# Patient Record
Sex: Female | Born: 1965 | Hispanic: Yes | Marital: Married | Smoking: Never smoker
Health system: Southern US, Community
[De-identification: ages and names within clinical notes are randomized; demographics above are authoritative.]

## PROBLEM LIST (undated history)

## (undated) DIAGNOSIS — D649 Anemia, unspecified: Secondary | ICD-10-CM

## (undated) DIAGNOSIS — D803 Selective deficiency of immunoglobulin G [IgG] subclasses: Secondary | ICD-10-CM

## (undated) DIAGNOSIS — H04123 Dry eye syndrome of bilateral lacrimal glands: Secondary | ICD-10-CM

## (undated) DIAGNOSIS — D8984 IgG4-related disease: Secondary | ICD-10-CM

## (undated) DIAGNOSIS — D8989 Other specified disorders involving the immune mechanism, not elsewhere classified: Secondary | ICD-10-CM

## (undated) HISTORY — PX: SALIVARY GLAND SURGERY: SHX768

## (undated) HISTORY — PX: CHOLECYSTECTOMY: SHX55

## (undated) HISTORY — DX: Selective deficiency of immunoglobulin g (igg) subclasses: D80.3

## (undated) HISTORY — PX: BREAST SURGERY: SHX581

## (undated) HISTORY — DX: Other specified disorders involving the immune mechanism, not elsewhere classified: D89.89

## (undated) HISTORY — DX: Dry eye syndrome of bilateral lacrimal glands: H04.123

## (undated) HISTORY — DX: IgG4-related disease: D89.84

---

## 2007-10-15 ENCOUNTER — Ambulatory Visit: Payer: Self-pay | Admitting: Family Medicine

## 2008-04-15 ENCOUNTER — Ambulatory Visit: Payer: Self-pay | Admitting: Unknown Physician Specialty

## 2008-05-05 ENCOUNTER — Ambulatory Visit: Payer: Self-pay | Admitting: Unknown Physician Specialty

## 2010-07-03 ENCOUNTER — Ambulatory Visit: Payer: Self-pay | Admitting: Internal Medicine

## 2010-10-17 ENCOUNTER — Ambulatory Visit: Payer: Self-pay | Admitting: Internal Medicine

## 2010-11-17 ENCOUNTER — Ambulatory Visit: Payer: Self-pay | Admitting: Gastroenterology

## 2011-04-11 ENCOUNTER — Ambulatory Visit: Payer: Self-pay | Admitting: Unknown Physician Specialty

## 2014-11-22 ENCOUNTER — Other Ambulatory Visit: Payer: Self-pay | Admitting: Otolaryngology

## 2014-11-22 DIAGNOSIS — R609 Edema, unspecified: Secondary | ICD-10-CM

## 2014-11-23 ENCOUNTER — Ambulatory Visit
Admission: RE | Admit: 2014-11-23 | Discharge: 2014-11-23 | Disposition: A | Discharge status: Home / Self Care | Payer: BLUE CROSS/BLUE SHIELD | Source: Ambulatory Visit | Attending: Otolaryngology | Admitting: Otolaryngology

## 2014-11-23 DIAGNOSIS — R599 Enlarged lymph nodes, unspecified: Secondary | ICD-10-CM | POA: Insufficient documentation

## 2014-11-23 DIAGNOSIS — R609 Edema, unspecified: Secondary | ICD-10-CM

## 2014-11-23 MED ORDER — IOHEXOL 300 MG/ML  SOLN
75.0000 mL | Freq: Once | INTRAMUSCULAR | Status: AC | PRN
  Administered 2014-11-23: 75 mL via INTRAVENOUS

## 2014-11-24 ENCOUNTER — Other Ambulatory Visit: Payer: Self-pay | Admitting: Otolaryngology

## 2014-11-24 DIAGNOSIS — R221 Localized swelling, mass and lump, neck: Principal | ICD-10-CM

## 2014-11-24 DIAGNOSIS — R220 Localized swelling, mass and lump, head: Secondary | ICD-10-CM

## 2014-11-29 ENCOUNTER — Ambulatory Visit: Payer: BLUE CROSS/BLUE SHIELD

## 2014-12-10 ENCOUNTER — Other Ambulatory Visit: Payer: BLUE CROSS/BLUE SHIELD

## 2014-12-13 ENCOUNTER — Encounter: Payer: Self-pay | Admitting: *Deleted

## 2014-12-13 DIAGNOSIS — Z9049 Acquired absence of other specified parts of digestive tract: Secondary | ICD-10-CM | POA: Diagnosis not present

## 2014-12-13 DIAGNOSIS — Z833 Family history of diabetes mellitus: Secondary | ICD-10-CM | POA: Diagnosis not present

## 2014-12-13 DIAGNOSIS — Z8249 Family history of ischemic heart disease and other diseases of the circulatory system: Secondary | ICD-10-CM | POA: Diagnosis not present

## 2014-12-13 DIAGNOSIS — Z79899 Other long term (current) drug therapy: Secondary | ICD-10-CM | POA: Diagnosis not present

## 2014-12-13 DIAGNOSIS — Z8489 Family history of other specified conditions: Secondary | ICD-10-CM | POA: Diagnosis not present

## 2014-12-13 DIAGNOSIS — R591 Generalized enlarged lymph nodes: Secondary | ICD-10-CM | POA: Diagnosis not present

## 2014-12-13 NOTE — Patient Instructions (Signed)
  Your procedure is scheduled on: 12-15-14 Report to [facility] Desk To find out your arrival time please call [phone] between 1PM - 3PM on 12-14-14  Remember: Instructions that are not followed completely may result in serious medical risk, up to and including death, or upon the discretion of your surgeon and anesthesiologist your surgery may need to be rescheduled.    _x___ 1. Do not eat food or drink liquids after midnight. No gum chewing or hard candies.     _x___ 2. No Alcohol for 24 hours before or after surgery.   ____ 3. Bring all medications with you on the day of surgery if instructed.    ____ 4. Notify your doctor if there is any change in your medical condition     (cold, fever, infections).     Do not wear jewelry, make-up, hairpins, clips or nail polish.  Do not wear lotions, powders, or perfumes. You may wear deodorant.  Do not shave 48 hours prior to surgery. Men may shave face and neck.  Do not bring valuables to the hospital.    [facility] is not responsible for any belongings or valuables.               Contacts, dentures or bridgework may not be worn into surgery.  Leave your suitcase in the car. After surgery it may be brought to your room.  For patients admitted to the hospital, discharge time is determined by your                treatment team.   Patients discharged the day of surgery will not be allowed to drive home.   Please read over the following fact sheets that you were given:      ____ Take these medicines the morning of surgery with A SIP OF WATER: NONE   1.  2.   3.   4.  5.  6.  ____ Fleet Enema (as directed)   ____ Use CHG Soap as directed  ____ Use inhalers on the day of surgery  ____ Stop metformin 2 days prior to surgery    ____ Take 1/2 of usual insulin dose the night before surgery and none on the morning of surgery.   ____ Stop Coumadin/Plavix/aspirin  ____ Stop Anti-inflammatories-TYLENOL OK TO  TAKE   ____ Stop supplements until after surgery.    ____ Bring C-Pap to the hospital.

## 2014-12-15 ENCOUNTER — Ambulatory Visit: Payer: BLUE CROSS/BLUE SHIELD | Admitting: Anesthesiology

## 2014-12-15 ENCOUNTER — Encounter
Admission: RE | Disposition: A | Discharge status: Home / Self Care | Payer: Self-pay | Source: Ambulatory Visit | Attending: Otolaryngology

## 2014-12-15 ENCOUNTER — Ambulatory Visit
Admission: RE | Admit: 2014-12-15 | Discharge: 2014-12-15 | Disposition: A | Discharge status: Home / Self Care | Payer: BLUE CROSS/BLUE SHIELD | Source: Ambulatory Visit | Attending: Otolaryngology | Admitting: Otolaryngology

## 2014-12-15 DIAGNOSIS — R591 Generalized enlarged lymph nodes: Secondary | ICD-10-CM | POA: Diagnosis not present

## 2014-12-15 DIAGNOSIS — Z8249 Family history of ischemic heart disease and other diseases of the circulatory system: Secondary | ICD-10-CM | POA: Insufficient documentation

## 2014-12-15 DIAGNOSIS — R590 Localized enlarged lymph nodes: Secondary | ICD-10-CM

## 2014-12-15 DIAGNOSIS — R609 Edema, unspecified: Secondary | ICD-10-CM

## 2014-12-15 DIAGNOSIS — Z8489 Family history of other specified conditions: Secondary | ICD-10-CM | POA: Insufficient documentation

## 2014-12-15 DIAGNOSIS — Z79899 Other long term (current) drug therapy: Secondary | ICD-10-CM | POA: Insufficient documentation

## 2014-12-15 DIAGNOSIS — Z833 Family history of diabetes mellitus: Secondary | ICD-10-CM | POA: Insufficient documentation

## 2014-12-15 DIAGNOSIS — Z9049 Acquired absence of other specified parts of digestive tract: Secondary | ICD-10-CM | POA: Insufficient documentation

## 2014-12-15 HISTORY — DX: Anemia, unspecified: D64.9

## 2014-12-15 HISTORY — PX: LYMPH NODE BIOPSY: SHX201

## 2014-12-15 LAB — POCT PREGNANCY, URINE: Preg Test, Ur: NEGATIVE

## 2014-12-15 SURGERY — LYMPH NODE BIOPSY
Anesthesia: General | Laterality: Right

## 2014-12-15 MED ORDER — FENTANYL CITRATE (PF) 100 MCG/2ML IJ SOLN
INTRAMUSCULAR | Status: DC | PRN
  Administered 2014-12-15: 100 ug via INTRAVENOUS

## 2014-12-15 MED ORDER — FAMOTIDINE 20 MG PO TABS
ORAL_TABLET | ORAL | Status: AC
  Administered 2014-12-15: 20 mg via ORAL
  Filled 2014-12-15: qty 1

## 2014-12-15 MED ORDER — BACITRACIN ZINC 500 UNIT/GM EX OINT
TOPICAL_OINTMENT | CUTANEOUS | Status: AC
  Filled 2014-12-15: qty 0.9

## 2014-12-15 MED ORDER — FENTANYL CITRATE (PF) 100 MCG/2ML IJ SOLN
25.0000 ug | INTRAMUSCULAR | Status: AC | PRN
  Administered 2014-12-15 (×6): 25 ug via INTRAVENOUS

## 2014-12-15 MED ORDER — ROCURONIUM BROMIDE 100 MG/10ML IV SOLN
INTRAVENOUS | Status: DC | PRN
  Administered 2014-12-15: 20 mg via INTRAVENOUS

## 2014-12-15 MED ORDER — NEOSTIGMINE METHYLSULFATE 10 MG/10ML IV SOLN
INTRAVENOUS | Status: DC | PRN
  Administered 2014-12-15: 3.5 mg via INTRAVENOUS

## 2014-12-15 MED ORDER — BACITRACIN 500 UNIT/GM EX OINT
TOPICAL_OINTMENT | CUTANEOUS | Status: DC | PRN
  Administered 2014-12-15: 1 via TOPICAL

## 2014-12-15 MED ORDER — MIDAZOLAM HCL 2 MG/2ML IJ SOLN
INTRAMUSCULAR | Status: DC | PRN
  Administered 2014-12-15: 2 mg via INTRAVENOUS
  Administered 2014-12-15: 1 mg via INTRAVENOUS

## 2014-12-15 MED ORDER — LACTATED RINGERS IV SOLN
INTRAVENOUS | Status: DC | PRN
  Administered 2014-12-15: 09:00:00 via INTRAVENOUS

## 2014-12-15 MED ORDER — DEXAMETHASONE SODIUM PHOSPHATE 4 MG/ML IJ SOLN
INTRAMUSCULAR | Status: DC | PRN
  Administered 2014-12-15: 8 mg via INTRAVENOUS

## 2014-12-15 MED ORDER — SODIUM CHLORIDE 0.9 % IR SOLN
Status: DC | PRN
  Administered 2014-12-15: 125 mL

## 2014-12-15 MED ORDER — ONDANSETRON HCL 4 MG/2ML IJ SOLN
INTRAMUSCULAR | Status: DC | PRN
  Administered 2014-12-15: 4 mg via INTRAVENOUS

## 2014-12-15 MED ORDER — ONDANSETRON HCL 4 MG/2ML IJ SOLN: 4.0000 mg | Freq: Once | INTRAMUSCULAR | Status: DC | PRN

## 2014-12-15 MED ORDER — GLYCOPYRROLATE 0.2 MG/ML IJ SOLN
INTRAMUSCULAR | Status: DC | PRN
  Administered 2014-12-15: .6 mg via INTRAVENOUS

## 2014-12-15 MED ORDER — LIDOCAINE-EPINEPHRINE (PF) 1 %-1:200000 IJ SOLN
INTRAMUSCULAR | Status: AC
  Filled 2014-12-15: qty 30

## 2014-12-15 MED ORDER — FENTANYL CITRATE (PF) 100 MCG/2ML IJ SOLN
INTRAMUSCULAR | Status: AC
  Administered 2014-12-15: 25 ug via INTRAVENOUS
  Filled 2014-12-15: qty 2

## 2014-12-15 MED ORDER — LACTATED RINGERS IV SOLN
Freq: Once | INTRAVENOUS | Status: AC
  Administered 2014-12-15: 08:00:00 via INTRAVENOUS

## 2014-12-15 MED ORDER — EPHEDRINE SULFATE 50 MG/ML IJ SOLN
INTRAMUSCULAR | Status: DC | PRN
  Administered 2014-12-15: 5 mg via INTRAVENOUS
  Administered 2014-12-15: 10 mg via INTRAVENOUS

## 2014-12-15 MED ORDER — PROPOFOL 10 MG/ML IV BOLUS
INTRAVENOUS | Status: DC | PRN
  Administered 2014-12-15: 200 mg via INTRAVENOUS

## 2014-12-15 MED ORDER — LIDOCAINE-EPINEPHRINE (PF) 1 %-1:200000 IJ SOLN
INTRAMUSCULAR | Status: DC | PRN
  Administered 2014-12-15: 3 mL

## 2014-12-15 MED ORDER — FAMOTIDINE 20 MG PO TABS
20.0000 mg | ORAL_TABLET | Freq: Once | ORAL | Status: AC
  Administered 2014-12-15: 20 mg via ORAL

## 2014-12-15 MED ORDER — HYDROCODONE-ACETAMINOPHEN 5-300 MG PO TABS: 1.0000 | ORAL_TABLET | ORAL | Status: DC | PRN

## 2014-12-15 SURGICAL SUPPLY — 34 items
BLADE SURG 15 STRL LF DISP TIS (BLADE) ×1 IMPLANT
BLADE SURG 15 STRL SS (BLADE) ×1
CANISTER SUCT 1200ML W/VALVE (MISCELLANEOUS) ×2 IMPLANT
CNTNR SPEC 2.5X3XGRAD LEK (MISCELLANEOUS) ×1
CONT SPEC 4OZ STER OR WHT (MISCELLANEOUS) ×1
CONTAINER SPEC 2.5X3XGRAD LEK (MISCELLANEOUS) ×1 IMPLANT
CORD BIP STRL DISP 12FT (MISCELLANEOUS) ×2 IMPLANT
DECANTER SPIKE VIAL GLASS SM (MISCELLANEOUS) ×2 IMPLANT
DRAPE MAG INST 16X20 L/F (DRAPES) ×2 IMPLANT
DRESSING TELFA 4X3 1S ST N-ADH (GAUZE/BANDAGES/DRESSINGS) IMPLANT
DRSG TEGADERM 2-3/8X2-3/4 SM (GAUZE/BANDAGES/DRESSINGS) IMPLANT
DRSG TEGADERM 2X2.25 PEDS (GAUZE/BANDAGES/DRESSINGS) ×2 IMPLANT
FORCEPS JEWEL BIP 4-3/4 STR (INSTRUMENTS) ×2 IMPLANT
GLOVE BIO SURGEON STRL SZ7.5 (GLOVE) ×4 IMPLANT
GOWN STRL REUS W/ TWL LRG LVL3 (GOWN DISPOSABLE) ×2 IMPLANT
GOWN STRL REUS W/TWL LRG LVL3 (GOWN DISPOSABLE) ×2
HARMONIC SCALPEL FOCUS (MISCELLANEOUS) IMPLANT
HOOK STAY 5M SHARP BLUNT 3316- (MISCELLANEOUS) IMPLANT
JACKSON PRATT 7MM (INSTRUMENTS) IMPLANT
KIT RM TURNOVER STRD PROC AR (KITS) ×2 IMPLANT
LABEL OR SOLS (LABEL) IMPLANT
NS IRRIG 500ML POUR BTL (IV SOLUTION) ×2 IMPLANT
PACK HEAD/NECK (MISCELLANEOUS) ×2 IMPLANT
PAD GROUND ADULT SPLIT (MISCELLANEOUS) ×2 IMPLANT
PAD TELFA 2X3 NADH STRL (GAUZE/BANDAGES/DRESSINGS) ×2 IMPLANT
SPONGE KITTNER 5P (MISCELLANEOUS) ×2 IMPLANT
SUCTION FRAZIER TIP 10 FR DISP (SUCTIONS) IMPLANT
SUT PROLENE 3 0 FS 2 (SUTURE) ×2 IMPLANT
SUT SILK 3 0 (SUTURE)
SUT SILK 3-0 18XBRD TIE 12 (SUTURE) IMPLANT
SUT VIC AB 4-0 FS2 27 (SUTURE) IMPLANT
SUT VIC AB 4-0 RB1 18 (SUTURE) ×2 IMPLANT
SUTURE ×2 IMPLANT
VIALFLOW ×2 IMPLANT

## 2014-12-15 NOTE — Discharge Instructions (Signed)
AMBULATORY SURGERY  DISCHARGE INSTRUCTIONS   1) The drugs that you were given will stay in your system until tomorrow so for the next 24 hours you should not:  A) Drive an automobile B) Make any legal decisions C) Drink any alcoholic beverage   2) You may resume regular meals tomorrow.  Today it is better to start with liquids and gradually work up to solid foods.  You may eat anything you prefer, but it is better to start with liquids, then soup and crackers, and gradually work up to solid foods.   3) Please notify your doctor immediately if you have any unusual bleeding, trouble breathing, redness and pain at the surgery site, drainage, fever, or pain not relieved by medication. 4)   5) Your post-operative visit with Dr.    George Ina                                 is: Date:                        Time:    Please call to schedule your post-operative visit.  6) Additional Instructions: 7)

## 2014-12-15 NOTE — Op Note (Signed)
12/15/2014  10:56 AM    Connie Charles  [id number]   Pre-Op Diagnosis:  right cervical lymphadanopathy Post-op Diagnosis: Same  Procedure:   Excisional biopsy of right deep cervical lymph node (group 1) Surgeon:  Riley Nearing  Anesthesia:  General endotracheal  EBL:  5 cc  Complications:  None  Findings: 28 x 20 mm right group 1 cervical lymph node  Procedure: After the patient was identified in holding and the procedure was reviewed.  The patient was taken to the operating room and with the patient in a comfortable supine position,  general endotracheal anesthesia was induced without difficulty.  A proper time-out was performed, confirming the operative site and procedure.  The skin was injected with 1% lidocaine with epinephrine 1:200,000, 2 fingerbreadths below the mandible. The patient was then prepped and draped in the usual sterile fashion with the lower lip exposed to help monitor the marginal mandibular nerve during the procedure. I then confirmed with anesthesia that the patient's paralysis from induction had worn off. A 15 blade was used to incise the skin, carrying the incision down through the platysma carefully using the bipolar cautery, and carefully monitoring the marginal mandibular nerve. Blunt dissection proceeded deeply until the lower edge of the right submandibular gland was identified. Dissection then proceeded superiorly to the lymph node, which was just above the submandibular gland. The lymph node was then carefully dissected out bluntly. Attachments to the lymph node were divided with the bipolar cautery, again monitoring for any stimulation of the marginal mandibular nerve. At no point during the procedure did the nerve stimulate. Continued careful dissection around the node was performed until the node could be pulled out from the depths of the wound. The hilum of the node was then divided with the bipolar cautery. The node was removed and sent for lymphoma  studies. The wound was irrigated with saline and inspected for bleeding. Excellent hemostasis was noted. The platysma was then carefully closed with 4-0 Vicryl suture followed by closure of the subcutaneous tissues. The skin was closed with 5-0 Prolene suture in a running locked stitch. Bacitracin was applied to the wound followed by a dressing.  The patient was then returned to the anesthesiologist in good condition for awakening. The patient was awakened and taken to the recovery room in good condition.   Disposition:   PACU then d/c home  Plan: Polysporin to the wound twice daily for 5 days. Ice may be applied as needed. Follow-up in 1 week for suture removal. Take pain medications as prescribed.  Riley Nearing 12/15/2014 10:56 AM

## 2014-12-15 NOTE — Anesthesia Preprocedure Evaluation (Signed)
Anesthesia Evaluation  Patient identified by MRN, date of birth, ID band Patient awake    Reviewed: Allergy & Precautions, NPO status , Patient's Chart, lab work & pertinent test results  History of Anesthesia Complications Negative for: history of anesthetic complications  Airway Mallampati: II       Dental no notable dental hx. (+) Teeth Intact   Pulmonary neg pulmonary ROS,    Pulmonary exam normal       Cardiovascular negative cardio ROS Normal cardiovascular exam    Neuro/Psych negative neurological ROS  negative psych ROS   GI/Hepatic negative GI ROS, Neg liver ROS,   Endo/Other  negative endocrine ROS  Renal/GU negative Renal ROS  negative genitourinary   Musculoskeletal negative musculoskeletal ROS (+)   Abdominal Normal abdominal exam  (+)   Peds negative pediatric ROS (+)  Hematology  (+) anemia ,   Anesthesia Other Findings   Reproductive/Obstetrics negative OB ROS                             Anesthesia Physical Anesthesia Plan  ASA: II  Anesthesia Plan: General   Post-op Pain Management:    Induction: Intravenous  Airway Management Planned: LMA  Additional Equipment:   Intra-op Plan:   Post-operative Plan: Extubation in OR  Informed Consent: I have reviewed the patients History and Physical, chart, labs and discussed the procedure including the risks, benefits and alternatives for the proposed anesthesia with the patient or authorized representative who has indicated his/her understanding and acceptance.     Plan Discussed with: CRNA  Anesthesia Plan Comments:         Anesthesia Quick Evaluation

## 2014-12-15 NOTE — OR Nursing (Signed)
Pt. Voided on emergence from anesthesia

## 2014-12-15 NOTE — Anesthesia Procedure Notes (Signed)
Procedure Name: Intubation Performed by: Cristie Mckinney Pre-anesthesia Checklist: Patient identified, Emergency Drugs available, Suction available, Patient being monitored and Timeout performed Patient Re-evaluated:Patient Re-evaluated prior to inductionOxygen Delivery Method: Circle system utilized Preoxygenation: Pre-oxygenation with 100% oxygen Intubation Type: IV induction Ventilation: Mask ventilation without difficulty Laryngoscope Size: Mac and 3 Grade View: Grade II Tube type: Oral Tube size: 6.0 mm Number of attempts: 3 Airway Equipment and Method: Stylet Placement Confirmation: ETT inserted through vocal cords under direct vision,  positive ETCO2 and breath sounds checked- equal and bilateral Secured at: 20 cm Tube secured with: Tape Dental Injury: Teeth and Oropharynx as per pre-operative assessment  Difficulty Due To: Difficult Airway- due to anterior larynx Comments: 2 DLs per Larina Bras, CRNA   #1)7.0 ett without stylet, could visualize arytenoids and opening.  #2) added stylet, changed head position.  MAC 3  7.0 ETT again, could not direct into opening.  DL #3 per Dr. Helyn Numbers 6.0 ett same blade.  Success with BBS and ETCO2.  Given 8 mg Decadron iv.  Small mouth, anterior larynx

## 2014-12-15 NOTE — Transfer of Care (Signed)
Immediate Anesthesia Transfer of Care Note  Patient: Connie Charles  Procedure(s) Performed: Procedure(s): LYMPH NODE BIOPSY (Right)  Patient Location: PACU  Anesthesia Type:General  Level of Consciousness: awake and patient cooperative  Airway & Oxygen Therapy: Patient Spontanous Breathing and Patient connected to face mask oxygen  Post-op Assessment: Report given to RN and Post -op Vital signs reviewed and stable  Post vital signs: Reviewed and stable  Last Vitals:  Filed Vitals:   12/15/14 1106  BP: 111/49  Pulse: 95  Temp:   Resp: 13    Complications: No apparent anesthesia complications

## 2014-12-15 NOTE — Anesthesia Postprocedure Evaluation (Signed)
  Anesthesia Post-op Note  Patient: Connie Charles  Procedure(s) Performed: Procedure(s): LYMPH NODE BIOPSY (Right)  Anesthesia type:General  Patient location: PACU  Post pain: Pain level controlled  Post assessment: Post-op Vital signs reviewed, Patient's Cardiovascular Status Stable, Respiratory Function Stable, Patent Airway and No signs of Nausea or vomiting  Post vital signs: Reviewed and stable  Last Vitals:  Filed Vitals:   12/15/14 1106  BP: 111/49  Pulse: 95  Temp:   Resp: 13    Level of consciousness: awake, alert  and patient cooperative  Complications: No apparent anesthesia complications

## 2014-12-15 NOTE — H&P (Signed)
History and physical reviewed and will be scanned in later. No change in medical status reported by the patient or family, appears stable for surgery. All questions regarding the procedure answered, and patient (or family if a child) expressed understanding of the procedure.  Connie Charles @TODAY@ 

## 2014-12-16 ENCOUNTER — Encounter: Payer: Self-pay | Admitting: Otolaryngology

## 2014-12-16 LAB — SURGICAL PATHOLOGY

## 2014-12-22 ENCOUNTER — Encounter: Payer: Self-pay | Admitting: Otolaryngology

## 2014-12-24 NOTE — Addendum Note (Signed)
Addendum  created 12/24/14 1134 by Iver Nestle, MD   Modules edited: Anesthesia Events, Narrator   Narrator:  Narrator: Event Log Edited

## 2015-01-07 ENCOUNTER — Telehealth: Payer: Self-pay | Admitting: Surgery

## 2015-01-07 NOTE — Telephone Encounter (Signed)
Called pt to sch. Per [facility]. Pt said she wants to talk to dr. Again and she will call us back to make appt.

## 2015-10-22 HISTORY — PX: AUGMENTATION MAMMAPLASTY: SUR837

## 2017-03-28 ENCOUNTER — Encounter: Payer: Self-pay | Admitting: *Deleted

## 2017-03-28 ENCOUNTER — Ambulatory Visit
Admission: EM | Admit: 2017-03-28 | Discharge: 2017-03-28 | Disposition: A | Discharge status: Home / Self Care | Payer: BLUE CROSS/BLUE SHIELD | Attending: Family Medicine | Admitting: Family Medicine

## 2017-03-28 DIAGNOSIS — J0100 Acute maxillary sinusitis, unspecified: Secondary | ICD-10-CM

## 2017-03-28 MED ORDER — AMOXICILLIN-POT CLAVULANATE 875-125 MG PO TABS: 1.0000 | ORAL_TABLET | Freq: Two times a day (BID) | ORAL | 0 refills | Status: DC

## 2017-03-28 MED ORDER — LORATADINE-PSEUDOEPHEDRINE ER 10-240 MG PO TB24: 1.0000 | ORAL_TABLET | Freq: Every day | ORAL | 0 refills | Status: DC

## 2017-03-28 MED ORDER — PREDNISONE 10 MG (21) PO TBPK: ORAL_TABLET | ORAL | 0 refills | Status: DC

## 2017-03-28 NOTE — ED Triage Notes (Signed)
Dyspnea, dizziness, head congestion and facial pain x2 weeks.

## 2017-03-28 NOTE — ED Provider Notes (Signed)
[facility]    CSN: [id number] Arrival date & time: 03/28/17  1808     History   Chief Complaint Chief Complaint  Patient presents with  . Shortness of Breath  . Dizziness    HPI Connie Charles is a 51 y.o. female.   Patient is a 51 year old Hispanic female who reports for the last 3 weeks she's had nasal congestion and difficulty breathing.  She was seen in the ED for conjunctivitis was placed on drops that didn't help that much she started congestion afterwards. She states that she has pressure on the right side swelling on the right side and difficulty breathing otherwise she reports increased pressure dizziness today she has appointment with her doctor next week because of the dizziness today she had to come in to be seen. She's had a history of anemia breast surgery augmentation, cholecystectomy and lymph node biopsy. She does not smoke to stop tobacco use illicit drugs. No known drug allergies.   The history is provided by the patient. No language interpreter was used.  Shortness of Breath  Severity:  Moderate Onset quality:  Sudden Duration:  3 weeks Timing:  Constant Chronicity:  New Context: URI   Context: not smoke exposure   Relieved by:  Nothing Worsened by:  Deep breathing Ineffective treatments: steroid nasal spray. Associated symptoms: headaches   Associated symptoms comment:  Dizzyness Risk factors: no recent surgery and no tobacco use   Dizziness  Associated symptoms: headaches and shortness of breath     Past Medical History:  Diagnosis Date  . Anemia     Patient Active Problem List   Diagnosis Date Noted  . Edema 12/15/2014    Past Surgical History:  Procedure Laterality Date  . BREAST SURGERY     BREAST AUGMENTATION  . CHOLECYSTECTOMY    . LYMPH NODE BIOPSY Right 12/15/2014   Procedure: LYMPH NODE BIOPSY;  Surgeon: Clyde Canterbury, MD;  Location: [facility] ORS;  Service: ENT;  Laterality: Right;    OB History    No data available        Home Medications    Prior to Admission medications   Medication Sig Start Date End Date Taking? Authorizing Provider  amoxicillin-clavulanate (AUGMENTIN) 875-125 MG tablet Take 1 tablet by mouth 2 (two) times daily. 03/28/17   Frederich Cha, MD  ferrous sulfate 325 (65 FE) MG tablet Take 325 mg by mouth daily with breakfast.    [provider]  Hydrocodone-Acetaminophen 5-300 MG TABS Take 1-2 tablets by mouth every 4 (four) hours as needed. 12/15/14   Clyde Canterbury, MD  loratadine-pseudoephedrine (CLARITIN-D 24 HOUR) 10-240 MG 24 hr tablet Take 1 tablet by mouth daily. 03/28/17   Frederich Cha, MD  predniSONE (STERAPRED UNI-PAK 21 TAB) 10 MG (21) TBPK tablet Sig 6 tablet day 1, 5 tablets day 2, 4 tablets day 3,,3tablets day 4, 2 tablets day 5, 1 tablet day 6 take all tablets orally 03/28/17   Frederich Cha, MD    Family History History reviewed. No pertinent family history.  Social History Social History  Substance Use Topics  . Smoking status: Never Smoker  . Smokeless tobacco: Never Used  . Alcohol use No     Allergies   Patient has no known allergies.   Review of Systems Review of Systems  Respiratory: Positive for shortness of breath.   Neurological: Positive for dizziness and headaches.  All other systems reviewed and are negative.    Physical Exam Triage Vital Signs ED Triage  Vitals  Enc Vitals Group     BP 03/28/17 1903 (!) 134/59     Pulse Rate 03/28/17 1903 63     Resp 03/28/17 1903 16     Temp 03/28/17 1903 98.3 F (36.8 C)     Temp Source 03/28/17 1903 Oral     SpO2 03/28/17 1903 100 %     Weight 03/28/17 1905 137 lb (62.1 kg)     Height 03/28/17 1905 5\' 2"  (1.575 m)     Head Circumference --      Peak Flow --      Pain Score --      Pain Loc --      Pain Edu? --      Excl. in [place]? --    No data found.   Updated Vital Signs BP (!) 134/59 (BP Location: Left Arm)   Pulse 63   Temp 98.3 F (36.8 C) (Oral)   Resp 16   Ht 5\' 2"  (1.575 m)    Wt 137 lb (62.1 kg)   SpO2 100%   BMI 25.06 kg/m   Visual Acuity Right Eye Distance:   Left Eye Distance:   Bilateral Distance:    Right Eye Near:   Left Eye Near:    Bilateral Near:     Physical Exam  Constitutional: She is oriented to person, place, and time. She appears well-developed and well-nourished.  HENT:  Head: Normocephalic and atraumatic.  Right Ear: Hearing, tympanic membrane, external ear and ear canal normal.  Left Ear: Hearing, tympanic membrane, external ear and ear canal normal.  Nose: Mucosal edema present. Right sinus exhibits maxillary sinus tenderness. Right sinus exhibits no frontal sinus tenderness. Left sinus exhibits no maxillary sinus tenderness and no frontal sinus tenderness.    Mouth/Throat: Uvula is midline, oropharynx is clear and moist and mucous membranes are normal. No uvula swelling.  Patient was swelling on the right upper labial fold right nasal turbinates markedly more swollen than the left and difficulty seeing open passage in the right nostril  Eyes: Pupils are equal, round, and reactive to light. Conjunctivae and EOM are normal.  Neck: Normal range of motion. Neck supple. No tracheal deviation present.  Pulmonary/Chest: Effort normal.  Musculoskeletal: Normal range of motion. She exhibits no deformity.  Neurological: She is alert and oriented to person, place, and time.  Skin: Skin is warm and dry.  Psychiatric: She has a normal mood and affect.  Vitals reviewed.    UC Treatments / Results  Labs (all labs ordered are listed, but only abnormal results are displayed) Labs Reviewed - No data to display  EKG  EKG Interpretation None       Radiology No results found.  Procedures Procedures (including critical care time)  Medications Ordered in UC Medications - No data to display   Initial Impression / Assessment and Plan / UC Course  I have reviewed the triage vital signs and the nursing notes.  Pertinent labs &  imaging results that were available during my care of the patient were reviewed by me and considered in my medical decision making (see chart for details).    We'll place patient on Allegra-D or Claritin-D Augmentin 875 one tablet twice a day and will place a 60 course of prednisone as well. Follow-up PCP if not better in one week.   Final Clinical Impressions(s) / UC Diagnoses   Final diagnoses:  Acute maxillary sinusitis, recurrence not specified    New Prescriptions New Prescriptions  AMOXICILLIN-CLAVULANATE (AUGMENTIN) 875-125 MG TABLET    Take 1 tablet by mouth 2 (two) times daily.   LORATADINE-PSEUDOEPHEDRINE (CLARITIN-D 24 HOUR) 10-240 MG 24 HR TABLET    Take 1 tablet by mouth daily.   PREDNISONE (STERAPRED UNI-PAK 21 TAB) 10 MG (21) TBPK TABLET    Sig 6 tablet day 1, 5 tablets day 2, 4 tablets day 3,,3tablets day 4, 2 tablets day 5, 1 tablet day 6 take all tablets orally    Note: This dictation was prepared with Dragon dictation along with smaller phrase technology. Any transcriptional errors that result from this process are unintentional.  Controlled Substance Prescriptions [place] Controlled Substance Registry consulted? Not Applicable   Frederich Cha, MD 03/28/17 1945

## 2017-10-21 DIAGNOSIS — J309 Allergic rhinitis, unspecified: Secondary | ICD-10-CM | POA: Insufficient documentation

## 2017-11-06 ENCOUNTER — Encounter: Payer: Self-pay | Admitting: Obstetrics and Gynecology

## 2017-11-06 ENCOUNTER — Ambulatory Visit: Payer: Self-pay | Admitting: Obstetrics and Gynecology

## 2017-11-06 ENCOUNTER — Ambulatory Visit (INDEPENDENT_AMBULATORY_CARE_PROVIDER_SITE_OTHER): Payer: BLUE CROSS/BLUE SHIELD | Admitting: Obstetrics and Gynecology

## 2017-11-06 VITALS — BP 120/80 | Ht 62.0 in | Wt 138.0 lb

## 2017-11-06 DIAGNOSIS — N898 Other specified noninflammatory disorders of vagina: Secondary | ICD-10-CM | POA: Diagnosis not present

## 2017-11-06 DIAGNOSIS — K644 Residual hemorrhoidal skin tags: Secondary | ICD-10-CM | POA: Diagnosis not present

## 2017-11-06 DIAGNOSIS — Z1239 Encounter for other screening for malignant neoplasm of breast: Secondary | ICD-10-CM

## 2017-11-06 DIAGNOSIS — Z1231 Encounter for screening mammogram for malignant neoplasm of breast: Secondary | ICD-10-CM | POA: Diagnosis not present

## 2017-11-06 DIAGNOSIS — Z124 Encounter for screening for malignant neoplasm of cervix: Secondary | ICD-10-CM

## 2017-11-07 NOTE — Progress Notes (Signed)
Gynecology Annual Exam  PCP: System, Pcp Not In  Chief Complaint:  Chief Complaint  Patient presents with  . Gynecologic Exam    History of Present Illness: Patient is a 52 y.o. [id number] presents for annual exam. The patient has no complaints today.   LMP: Patient's last menstrual period was 10/19/2017. Average Interval: irregular, 24 days Duration of flow: a few days Heavy Menses: yes Clots: yes Intermenstrual Bleeding: yes Postcoital Bleeding: no Dysmenorrhea: yes   The patient is sexually active. She currently uses none for contraception. She admits to dyspareunia.  The patient does not perform self breast exams.  There is no notable family history of breast or ovarian cancer in her family.  The patient wears seatbelts: yes.   The patient has regular exercise: yes.    The patient denies current symptoms of depression.    Review of Systems: Review of Systems  Constitutional: Negative for chills, fever, malaise/fatigue and weight loss.  HENT: Negative for congestion, hearing loss and sinus pain.   Eyes: Negative for blurred vision and double vision.  Respiratory: Negative for cough, sputum production, shortness of breath and wheezing.   Cardiovascular: Negative for chest pain, palpitations, orthopnea and leg swelling.  Gastrointestinal: Negative for abdominal pain, constipation, diarrhea, nausea and vomiting.  Genitourinary: Negative for dysuria, flank pain, frequency, hematuria and urgency.  Musculoskeletal: Negative for back pain, falls and joint pain.  Skin: Negative for itching and rash.  Neurological: Negative for dizziness and headaches.  Psychiatric/Behavioral: Negative for depression, substance abuse and suicidal ideas. The patient is not nervous/anxious.     Past Medical History:  Past Medical History:  Diagnosis Date  . Anemia     Past Surgical History:  Past Surgical History:  Procedure Laterality Date  . BREAST SURGERY     BREAST AUGMENTATION  .  CHOLECYSTECTOMY    . LYMPH NODE BIOPSY Right 12/15/2014   Procedure: LYMPH NODE BIOPSY;  Surgeon: Clyde Canterbury, MD;  Location: [facility] ORS;  Service: ENT;  Laterality: Right;    Gynecologic History:  Patient's last menstrual period was 10/19/2017. Contraception: none Last Pap: Results were: 2015 NIL  Last mammogram: unknown  Obstetric History: [id number]  Family History:  Family History  Problem Relation Age of Onset  . Thyroid disease Mother     Social History:  Social History   Socioeconomic History  . Marital status: Married    Spouse name: Not on file  . Number of children: Not on file  . Years of education: Not on file  . Highest education level: Not on file  Occupational History  . Not on file  Social Needs  . Financial resource strain: Not on file  . Food insecurity:    Worry: Not on file    Inability: Not on file  . Transportation needs:    Medical: Not on file    Non-medical: Not on file  Tobacco Use  . Smoking status: Never Smoker  . Smokeless tobacco: Never Used  Substance and Sexual Activity  . Alcohol use: No  . Drug use: No  . Sexual activity: Yes    Birth control/protection: None  Lifestyle  . Physical activity:    Days per week: Not on file    Minutes per session: Not on file  . Stress: Not on file  Relationships  . Social connections:    Talks on phone: Not on file    Gets together: Not on file    Attends religious service: Not on file  Active member of club or organization: Not on file    Attends meetings of clubs or organizations: Not on file    Relationship status: Not on file  . Intimate partner violence:    Fear of current or ex partner: Not on file    Emotionally abused: Not on file    Physically abused: Not on file    Forced sexual activity: Not on file  Other Topics Concern  . Not on file  Social History Narrative  . Not on file    Allergies:  Allergies  Allergen Reactions  . Buspirone     Other reaction(s): Headache     Medications: Prior to Admission medications   Medication Sig Start Date End Date Taking? Authorizing Provider  loratadine-pseudoephedrine (CLARITIN-D 24 HOUR) 10-240 MG 24 hr tablet Take 1 tablet by mouth daily. 03/28/17  Yes Frederich Cha, MD  amoxicillin-clavulanate (AUGMENTIN) 875-125 MG tablet Take 1 tablet by mouth 2 (two) times daily. Patient not taking: Reported on 11/06/2017 03/28/17   Frederich Cha, MD  ferrous sulfate 325 (65 FE) MG tablet Take 325 mg by mouth daily with breakfast.    [provider]  Hydrocodone-Acetaminophen 5-300 MG TABS Take 1-2 tablets by mouth every 4 (four) hours as needed. Patient not taking: Reported on 11/06/2017 12/15/14   Clyde Canterbury, MD  predniSONE (STERAPRED UNI-PAK 21 TAB) 10 MG (21) TBPK tablet Sig 6 tablet day 1, 5 tablets day 2, 4 tablets day 3,,3tablets day 4, 2 tablets day 5, 1 tablet day 6 take all tablets orally Patient not taking: Reported on 11/06/2017 03/28/17   Frederich Cha, MD    Physical Exam Vitals: Blood pressure 120/80, height 5\' 2"  (1.575 m), weight 138 lb (62.6 kg), last menstrual period 10/19/2017.  General: NAD HEENT: normocephalic, anicteric Thyroid: no enlargement, no palpable nodules Pulmonary: No increased work of breathing, CTAB Cardiovascular: RRR, distal pulses 2+ Breast: Breast symmetrical, no tenderness, no palpable nodules or masses, no skin or nipple retraction present, no nipple discharge.  No axillary or supraclavicular lymphadenopathy. Abdomen: NABS, soft, non-tender, non-distended.  Umbilicus without lesions.  No hepatomegaly, splenomegaly or masses palpable. No evidence of hernia  Genitourinary:  External: Normal external female genitalia.  Normal urethral meatus, normal Bartholin's and Skene's glands.    Vagina: Normal vaginal mucosa, no evidence of prolapse.    Cervix: Grossly normal in appearance, no bleeding  Uterus: Non-enlarged, mobile, normal contour.  No CMT  Adnexa: ovaries non-enlarged, no adnexal  masses  Rectal: deferred. Hemorrhoidal skin tag noted.   Lymphatic: no evidence of inguinal lymphadenopathy Extremities: no edema, erythema, or tenderness Neurologic: Grossly intact Psychiatric: mood appropriate, affect full  Female chaperone present for pelvic and breast  portions of the physical exam    Assessment: 52 y.o. [id number] routine annual exam  Plan: Problem List Items Addressed This Visit    None    Visit Diagnoses    Screening for cervical cancer    -  Primary   Relevant Orders   PapIG, CtNgTv, HPV, rfx 16/18   Vaginal discharge       Relevant Orders   NuSwab BV and Candida, NAA   Residual hemorrhoidal skin tags       Relevant Orders   Ambulatory referral to Colorectal Surgery   Breast cancer screening       Relevant Orders   MM DIGITAL SCREENING BILATERAL      1) Mammogram - recommend yearly screening mammogram.  Mammogram Was ordered today  2) STI screening  wasoffered  and declined. She did desire screening for vaginitis and a Nuswab was sent.   3) ASCCP guidelines and rational discussed.  Patient opts for every 3 years screening interval  4) Contraception - the patient is currently using  none.  She is interested in changing to [place] IUD. She would like the IUD to help manage her heavy periods and dysmenorrhea.   5) Colonoscopy -- Screening recommended starting at age 64 for average risk individuals, age 42 for individuals deemed at increased risk (including African Americans) and recommended to continue until age 27.  For patient age 55-85 individualized approach is recommended.  Gold standard screening is via colonoscopy, Cologuard screening is an acceptable alternative for patient unwilling or unable to undergo colonoscopy.  "Colorectal cancer screening for average?risk adults: 2018 guideline update from the [place]: A Cancer Journal for Clinicians: Dec 19, 2016 . Patient referred to colorectal for a hemorrhoidal skin tag which is annoying  her when she walks.    6) Routine healthcare maintenance including cholesterol, diabetes screening discussed managed by PCP  7) Follow up for IUD placement when she starts her period/  [place], [place] 11/07/2017, 11:35 AM

## 2017-11-09 LAB — NUSWAB BV AND CANDIDA, NAA
Candida albicans, NAA: NEGATIVE
Candida glabrata, NAA: NEGATIVE

## 2017-11-09 NOTE — Progress Notes (Signed)
Negative nuswab, released to mychart.

## 2017-11-12 LAB — PAPIG, CTNGTV, HPV, RFX 16/18
Chlamydia, Nuc. Acid Amp: NEGATIVE
Gonococcus, Nuc. Acid Amp: NEGATIVE
HPV, high-risk: NEGATIVE
PAP Smear Comment: 0
TRICH VAG BY NAA: NEGATIVE

## 2017-11-13 ENCOUNTER — Other Ambulatory Visit: Payer: Self-pay | Admitting: Obstetrics and Gynecology

## 2017-11-13 ENCOUNTER — Ambulatory Visit
Admission: RE | Admit: 2017-11-13 | Discharge: 2017-11-13 | Disposition: A | Discharge status: Home / Self Care | Payer: BLUE CROSS/BLUE SHIELD | Source: Ambulatory Visit | Attending: Obstetrics and Gynecology | Admitting: Obstetrics and Gynecology

## 2017-11-13 DIAGNOSIS — R928 Other abnormal and inconclusive findings on diagnostic imaging of breast: Secondary | ICD-10-CM | POA: Diagnosis not present

## 2017-11-13 DIAGNOSIS — Z1231 Encounter for screening mammogram for malignant neoplasm of breast: Secondary | ICD-10-CM | POA: Diagnosis present

## 2017-11-13 DIAGNOSIS — Z1239 Encounter for other screening for malignant neoplasm of breast: Secondary | ICD-10-CM

## 2017-11-13 NOTE — Progress Notes (Signed)
NIL HPV- pap, negative nuswab

## 2017-11-17 NOTE — Progress Notes (Signed)
Needs additional imaging, released to mychart with note.

## 2017-11-18 ENCOUNTER — Telehealth: Payer: Self-pay | Admitting: Obstetrics and Gynecology

## 2017-11-18 ENCOUNTER — Other Ambulatory Visit: Payer: Self-pay | Admitting: Obstetrics and Gynecology

## 2017-11-18 DIAGNOSIS — R928 Other abnormal and inconclusive findings on diagnostic imaging of breast: Secondary | ICD-10-CM

## 2017-11-18 DIAGNOSIS — N6489 Other specified disorders of breast: Secondary | ICD-10-CM

## 2017-11-18 NOTE — Telephone Encounter (Signed)
Patient is schedule Tuesday, April 30 at 7:73 with Connie Charles for mirena insertion

## 2017-11-19 ENCOUNTER — Ambulatory Visit (INDEPENDENT_AMBULATORY_CARE_PROVIDER_SITE_OTHER): Payer: BLUE CROSS/BLUE SHIELD | Admitting: Obstetrics and Gynecology

## 2017-11-19 ENCOUNTER — Encounter: Payer: Self-pay | Admitting: Obstetrics and Gynecology

## 2017-11-19 VITALS — BP 122/80 | HR 75 | Ht 62.0 in | Wt 140.0 lb

## 2017-11-19 DIAGNOSIS — Z30430 Encounter for insertion of intrauterine contraceptive device: Secondary | ICD-10-CM | POA: Diagnosis not present

## 2017-11-19 MED ORDER — LEVONORGESTREL 20 MCG/24HR IU IUD
INTRAUTERINE_SYSTEM | Freq: Once | INTRAUTERINE | Status: DC
Start: 2017-11-19 — End: 2022-01-01

## 2017-11-19 NOTE — Telephone Encounter (Signed)
Mirena provided for this patient & received/charged on 11/19/17

## 2017-11-19 NOTE — Progress Notes (Signed)
   Chief Complaint  Patient presents with  . Contraception     IUD PROCEDURE NOTE:  Connie Charles is a 52 y.o. [phone] here for Mirena  IUD insertion for irreg perimenopausal bleeding, recommended by Dr. Gilman Schmidt.    BP 122/80   Pulse 75   Ht 5\' 2"  (1.575 m)   Wt 140 lb (63.5 kg)   LMP 11/15/2017   BMI 25.61 kg/m   IUD Insertion Procedure Note Patient identified, informed consent performed, consent signed.   Discussed risks of irregular bleeding, cramping, infection, malpositioning or misplacement of the IUD outside the uterus which may require further procedure such as laparoscopy, risk of failure <1%. Time out was performed.    Speculum placed in the vagina.  Cervix visualized.  Cleaned with Betadine x 2.  Grasped anteriorly with a single tooth tenaculum.  Uterus sounded to 9.0 cm.   IUD placed per manufacturer's recommendations.  Strings trimmed to 3 cm. Tenaculum was removed, good hemostasis noted.  Patient tolerated procedure well.   ASSESSMENT:  Encounter for insertion of intrauterine contraceptive device (IUD) - Plan: levonorgestrel (MIRENA) 20 MCG/24HR IUD   Meds ordered this encounter  Medications  . levonorgestrel (MIRENA) 20 MCG/24HR IUD     Plan:  Patient was given post-procedure instructions.  She was advised to have backup contraception for one week.   Call if you are having increasing pain, cramps or bleeding or if you have a fever greater than 100.4 degrees F., shaking chills, nausea or vomiting. Patient was also asked to check IUD strings periodically and follow up in 4 weeks for IUD check.  Return in about 1 month (around 12/17/2017) for IUD f/u.  Davan Hark B. Leili Eskenazi, PA-C 11/19/2017 10:15 AM

## 2017-11-19 NOTE — Patient Instructions (Addendum)
I value your feedback and entrusting us with your care. If you get a [facility] patient survey, I would appreciate you taking the time to let us know about your experience today. Thank you!  [facility] [phone]  Instructions after IUD insertion  Most women experience no significant problems after insertion of an IUD, however minor cramping and spotting for a few days is common. Cramps may be treated with ibuprofen 800mg every 8 hours or Tylenol 650 mg every 4 hours. Contact [facility] immediately if you experience any of the following symptoms during the next week: temperature >99.6 degrees, worsening pelvic pain, abdominal pain, fainting, unusually heavy vaginal bleeding, foul vaginal discharge, or if you think you have expelled the IUD.  Nothing inserted in the vagina for 48 hours. You will be scheduled for a follow up visit in approximately four weeks.  You should check monthly to be sure you can feel the IUD strings in the upper vagina. If you are having a monthly period, try to check after each period. If you cannot feel the IUD strings,  contact [facility] immediately so we can do an exam to determine if the IUD has been expelled.   Please use backup protection until we can confirm the IUD is in place.  Call [facility] if you are exposed to or diagnosed with a sexually transmitted infection, as we will need to discuss whether it is safe for you to continue using an IUD.   

## 2017-11-27 ENCOUNTER — Ambulatory Visit
Admission: RE | Admit: 2017-11-27 | Discharge: 2017-11-27 | Disposition: A | Discharge status: Home / Self Care | Payer: BLUE CROSS/BLUE SHIELD | Source: Ambulatory Visit | Attending: Obstetrics and Gynecology | Admitting: Obstetrics and Gynecology

## 2017-11-27 DIAGNOSIS — N6489 Other specified disorders of breast: Secondary | ICD-10-CM

## 2017-11-27 DIAGNOSIS — R928 Other abnormal and inconclusive findings on diagnostic imaging of breast: Secondary | ICD-10-CM

## 2017-12-23 ENCOUNTER — Ambulatory Visit (INDEPENDENT_AMBULATORY_CARE_PROVIDER_SITE_OTHER): Payer: BLUE CROSS/BLUE SHIELD | Admitting: Obstetrics and Gynecology

## 2017-12-23 ENCOUNTER — Encounter: Payer: Self-pay | Admitting: Obstetrics and Gynecology

## 2017-12-23 VITALS — BP 128/80 | HR 94 | Ht 62.0 in | Wt 139.0 lb

## 2017-12-23 DIAGNOSIS — N921 Excessive and frequent menstruation with irregular cycle: Secondary | ICD-10-CM

## 2017-12-23 DIAGNOSIS — Z30431 Encounter for routine checking of intrauterine contraceptive device: Secondary | ICD-10-CM | POA: Diagnosis not present

## 2017-12-23 DIAGNOSIS — Z975 Presence of (intrauterine) contraceptive device: Secondary | ICD-10-CM | POA: Diagnosis not present

## 2017-12-23 NOTE — Progress Notes (Signed)
   Chief Complaint  Patient presents with  . Follow-up    IUD check, Has has period sense may 18th      History of Present Illness:  Connie Charles is a 52 y.o. that had a Mirena IUD placed approximately 1 month ago. Since that time, she denies dyspareunia, pelvic pain, vaginal d/c, heavy bleeding. She has had a couple wks of light spotting, cramping when sx first started but they have resolved.  Had IUD placed due to perimenopausal bleeding.   Review of Systems  Constitutional: Negative for fever.  Gastrointestinal: Negative for blood in stool, constipation, diarrhea, nausea and vomiting.  Genitourinary: Positive for vaginal bleeding. Negative for dyspareunia, dysuria, flank pain, frequency, hematuria, urgency, vaginal discharge and vaginal pain.  Musculoskeletal: Negative for back pain.  Skin: Negative for rash.    Physical Exam:  BP 128/80   Pulse 94   Ht 5\' 2"  (1.575 m)   Wt 139 lb (63 kg)   LMP 12/07/2017 (Exact Date) Comment: Still on it  BMI 25.42 kg/m  Body mass index is 25.42 kg/m.  Pelvic exam:  Two IUD strings present seen coming from the cervical os. EGBUS, vaginal vault and cervix: within normal limits   Assessment:   Encounter for routine checking of intrauterine contraceptive device (IUD) - Strings in place.   Breakthrough bleeding with IUD - Reassurance. F/u if sx persist by 10/19 for u/s or if flow is heavy.   IUD strings present in proper location; pt doing well  Plan: F/u if any signs of infection or can no longer feel the strings.   Alicia B. Copland, PA-C 12/23/2017 10:07 AM

## 2017-12-23 NOTE — Patient Instructions (Signed)
I value your feedback and entrusting us with your care. If you get a Connie Charles patient survey, I would appreciate you taking the time to let us know about your experience today. Thank you! 

## 2018-02-24 ENCOUNTER — Other Ambulatory Visit: Payer: Self-pay | Admitting: Obstetrics and Gynecology

## 2018-02-24 DIAGNOSIS — K644 Residual hemorrhoidal skin tags: Secondary | ICD-10-CM

## 2018-02-26 ENCOUNTER — Other Ambulatory Visit: Payer: Self-pay | Admitting: Gastroenterology

## 2018-02-26 DIAGNOSIS — R1314 Dysphagia, pharyngoesophageal phase: Secondary | ICD-10-CM

## 2018-03-03 ENCOUNTER — Ambulatory Visit: Payer: BLUE CROSS/BLUE SHIELD

## 2018-03-11 ENCOUNTER — Ambulatory Visit
Admission: RE | Admit: 2018-03-11 | Discharge: 2018-03-11 | Disposition: A | Discharge status: Home / Self Care | Payer: BLUE CROSS/BLUE SHIELD | Source: Ambulatory Visit | Attending: Gastroenterology | Admitting: Gastroenterology

## 2018-03-11 DIAGNOSIS — R1314 Dysphagia, pharyngoesophageal phase: Secondary | ICD-10-CM | POA: Diagnosis not present

## 2018-04-14 ENCOUNTER — Other Ambulatory Visit: Payer: Self-pay | Admitting: Physical Medicine and Rehabilitation

## 2018-04-14 DIAGNOSIS — M5416 Radiculopathy, lumbar region: Secondary | ICD-10-CM

## 2018-04-25 ENCOUNTER — Ambulatory Visit
Admission: RE | Admit: 2018-04-25 | Discharge: 2018-04-25 | Disposition: A | Discharge status: Home / Self Care | Payer: BLUE CROSS/BLUE SHIELD | Source: Ambulatory Visit | Attending: Physical Medicine and Rehabilitation | Admitting: Physical Medicine and Rehabilitation

## 2018-04-25 DIAGNOSIS — M5126 Other intervertebral disc displacement, lumbar region: Secondary | ICD-10-CM | POA: Insufficient documentation

## 2018-04-25 DIAGNOSIS — R29890 Loss of height: Secondary | ICD-10-CM | POA: Diagnosis not present

## 2018-04-25 DIAGNOSIS — M5416 Radiculopathy, lumbar region: Secondary | ICD-10-CM | POA: Diagnosis not present

## 2018-04-25 DIAGNOSIS — M5137 Other intervertebral disc degeneration, lumbosacral region: Secondary | ICD-10-CM | POA: Insufficient documentation

## 2018-04-25 DIAGNOSIS — M48061 Spinal stenosis, lumbar region without neurogenic claudication: Secondary | ICD-10-CM | POA: Diagnosis not present

## 2018-07-22 ENCOUNTER — Ambulatory Visit
Admission: EM | Admit: 2018-07-22 | Discharge: 2018-07-22 | Disposition: A | Discharge status: Home / Self Care | Payer: BLUE CROSS/BLUE SHIELD | Attending: Emergency Medicine | Admitting: Emergency Medicine

## 2018-07-22 ENCOUNTER — Other Ambulatory Visit: Payer: Self-pay

## 2018-07-22 ENCOUNTER — Ambulatory Visit (INDEPENDENT_AMBULATORY_CARE_PROVIDER_SITE_OTHER): Payer: BLUE CROSS/BLUE SHIELD

## 2018-07-22 DIAGNOSIS — S63637A Sprain of interphalangeal joint of left little finger, initial encounter: Secondary | ICD-10-CM | POA: Insufficient documentation

## 2018-07-22 DIAGNOSIS — X58XXXA Exposure to other specified factors, initial encounter: Secondary | ICD-10-CM

## 2018-07-22 MED ORDER — MELOXICAM 15 MG PO TABS: 15.0000 mg | ORAL_TABLET | Freq: Every day | ORAL | 0 refills | Status: DC
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
## 2019-08-10 ENCOUNTER — Other Ambulatory Visit: Payer: Self-pay | Admitting: Physician Assistant

## 2019-08-10 DIAGNOSIS — H9209 Otalgia, unspecified ear: Secondary | ICD-10-CM

## 2019-08-17 ENCOUNTER — Ambulatory Visit: Payer: BC Managed Care – PPO

## 2019-08-26 ENCOUNTER — Other Ambulatory Visit: Payer: Self-pay | Admitting: Physician Assistant

## 2019-08-26 ENCOUNTER — Ambulatory Visit: Payer: BLUE CROSS/BLUE SHIELD | Admitting: Obstetrics and Gynecology

## 2019-09-04 ENCOUNTER — Ambulatory Visit (INDEPENDENT_AMBULATORY_CARE_PROVIDER_SITE_OTHER): Payer: BC Managed Care – PPO | Admitting: Obstetrics and Gynecology

## 2019-09-04 ENCOUNTER — Encounter: Payer: Self-pay | Admitting: Obstetrics and Gynecology

## 2019-09-04 ENCOUNTER — Other Ambulatory Visit: Payer: Self-pay

## 2019-09-04 VITALS — BP 120/80 | Ht 62.0 in | Wt 147.0 lb

## 2019-09-04 DIAGNOSIS — Z1329 Encounter for screening for other suspected endocrine disorder: Secondary | ICD-10-CM

## 2019-09-04 DIAGNOSIS — Z01411 Encounter for gynecological examination (general) (routine) with abnormal findings: Secondary | ICD-10-CM

## 2019-09-04 DIAGNOSIS — T8332XA Displacement of intrauterine contraceptive device, initial encounter: Secondary | ICD-10-CM | POA: Diagnosis not present

## 2019-09-04 DIAGNOSIS — Z1231 Encounter for screening mammogram for malignant neoplasm of breast: Secondary | ICD-10-CM

## 2019-09-04 DIAGNOSIS — Z130 Encounter for screening for diseases of the blood and blood-forming organs and certain disorders involving the immune mechanism: Secondary | ICD-10-CM

## 2019-09-04 DIAGNOSIS — N852 Hypertrophy of uterus: Secondary | ICD-10-CM | POA: Diagnosis not present

## 2019-09-04 DIAGNOSIS — Z0000 Encounter for general adult medical examination without abnormal findings: Secondary | ICD-10-CM

## 2019-09-04 DIAGNOSIS — Z131 Encounter for screening for diabetes mellitus: Secondary | ICD-10-CM

## 2019-09-04 DIAGNOSIS — Z13220 Encounter for screening for lipoid disorders: Secondary | ICD-10-CM

## 2019-09-04 NOTE — Progress Notes (Signed)
Gynecology Annual Exam  PCP: [facility]  Chief Complaint:  Chief Complaint  Patient presents with  . Gynecologic Exam    Wants to check hormone levels, having hot flashes, night sweats     History of Present Illness:Patient is a 54 y.o. [id number] presents for annual exam. The patient has no complaints today.   LMP: No LMP recorded. (Menstrual status: IUD).  Since the insertion of her IUD her bleeding has been muched improved. SH e has a lot less pain and a lot less bleeding. Only small amounts every 3-6 months.   She has had some hot flashes, they are not overly bothersome.   The patient is sexually active. She denies dyspareunia.  The patient does perform self breast exams.  There is no notable family history of breast or ovarian cancer in her family.  The patient has regular exercise: no.    The patient denies current symptoms of depression.     Review of Systems: Review of Systems  Constitutional: Negative for chills, fever, malaise/fatigue and weight loss.  HENT: Negative for congestion, hearing loss and sinus pain.   Eyes: Negative for blurred vision and double vision.  Respiratory: Negative for cough, sputum production, shortness of breath and wheezing.   Cardiovascular: Negative for chest pain, palpitations, orthopnea and leg swelling.  Gastrointestinal: Negative for abdominal pain, constipation, diarrhea, nausea and vomiting.  Genitourinary: Negative for dysuria, flank pain, frequency, hematuria and urgency.  Musculoskeletal: Negative for back pain, falls and joint pain.  Skin: Negative for itching and rash.  Neurological: Negative for dizziness and headaches.  Psychiatric/Behavioral: Negative for depression, substance abuse and suicidal ideas. The patient is not nervous/anxious.     Past Medical History:  Past Medical History:  Diagnosis Date  . Anemia   . Dry eyes, bilateral     Past Surgical History:  Past Surgical History:  Procedure  Laterality Date  . AUGMENTATION MAMMAPLASTY Bilateral 10/2015   put in 10 yrs ago , replaced in 10/2015  . BREAST SURGERY     BREAST AUGMENTATION  . CHOLECYSTECTOMY    . LYMPH NODE BIOPSY Right 12/15/2014   Procedure: LYMPH NODE BIOPSY;  Surgeon: Clyde Canterbury, MD;  Location: [facility] ORS;  Service: ENT;  Laterality: Right;    Gynecologic History:  No LMP recorded. (Menstrual status: IUD). Last Pap: Results were: 2019 NIL and HR HPV negative  Last mammogram: 2019 Results were: BI-RAD I  Obstetric [id number]  Family History:  Family History  Problem Relation Age of Onset  . Thyroid disease Mother   . Breast cancer Neg Hx     Social History:  Social History   Socioeconomic History  . Marital status: Married    Spouse name: Not on file  . Number of children: Not on file  . Years of education: Not on file  . Highest education level: Not on file  Occupational History  . Not on file  Tobacco Use  . Smoking status: Never Smoker  . Smokeless tobacco: Never Used  Substance and Sexual Activity  . Alcohol use: No  . Drug use: No  . Sexual activity: Yes    Birth control/protection: I.U.D.    Comment: Mirena   Other Topics Concern  . Not on file  Social History Narrative  . Not on file   Social Determinants of Health   Financial Resource Strain:   . Difficulty of Paying Living Expenses: Not on file  Food Insecurity:   . Worried About Running  Out of Food in the Last Year: Not on file  . Ran Out of Food in the Last Year: Not on file  Transportation Needs:   . Lack of Transportation (Medical): Not on file  . Lack of Transportation (Non-Medical): Not on file  Physical Activity:   . Days of Exercise per Week: Not on file  . Minutes of Exercise per Session: Not on file  Stress:   . Feeling of Stress : Not on file  Social Connections:   . Frequency of Communication with Friends and Family: Not on file  . Frequency of Social Gatherings with Friends and Family: Not on file    . Attends Religious Services: Not on file  . Active Member of Clubs or Organizations: Not on file  . Attends Archivist Meetings: Not on file  . Marital Status: Not on file  Intimate Partner Violence:   . Fear of Current or Ex-Partner: Not on file  . Emotionally Abused: Not on file  . Physically Abused: Not on file  . Sexually Abused: Not on file    Allergies:  Allergies  Allergen Reactions  . Buspirone     Other reaction(s): Headache    Medications: Prior to Admission medications   Medication Sig Start Date End Date Taking? Authorizing Provider  Multiple Vitamin (MULTI-VITAMIN) tablet Take by mouth.   Yes [provider]    Physical Exam Vitals: Blood pressure 120/80, height 5\' 2"  (1.575 m), weight 147 lb (66.7 kg).  General: NAD HEENT: normocephalic, anicteric Thyroid: no enlargement, no palpable nodules Pulmonary: No increased work of breathing, CTAB Cardiovascular: RRR, distal pulses 2+ Breast: Breast symmetrical, no tenderness, no palpable nodules or masses, no skin or nipple retraction present, no nipple discharge.  No axillary or supraclavicular lymphadenopathy. Abdomen: NABS, soft, non-tender, non-distended.  Umbilicus without lesions.  No hepatomegaly, splenomegaly or masses palpable. No evidence of hernia  Genitourinary:  External: Normal external female genitalia.  Normal urethral meatus, normal Bartholin's and Skene's glands.    Vagina: Normal vaginal mucosa, no evidence of prolapse.    Cervix: Grossly normal in appearance, no bleeding  Uterus: Enlarged, mobile, normal contour.  No CMT. Approx 12 cm in size  Adnexa: ovaries non-enlarged, no adnexal masses  Rectal: deferred  Lymphatic: no evidence of inguinal lymphadenopathy Extremities: no edema, erythema, or tenderness Neurologic: Grossly intact Psychiatric: mood appropriate, affect full  Female chaperone present for pelvic and breast  portions of the physical exam     Assessment: 54  y.o. [id number] routine annual exam  Plan: Problem List Items Addressed This Visit    None    Visit Diagnoses    Healthcare maintenance    -  Primary   Relevant Orders   US PELVIS TRANSVAGINAL NON-OB (TV ONLY)   CBC With Differential   Lipid panel   TSH   Hemoglobin [id number]   Basic Metabolic Panel (BMET)   Intrauterine contraceptive device threads lost, initial encounter       Relevant Orders   US PELVIS TRANSVAGINAL NON-OB (TV ONLY)   Bulky or enlarged uterus       Relevant Orders   US PELVIS TRANSVAGINAL NON-OB (TV ONLY)   Screening, anemia, deficiency, iron       Relevant Orders   CBC With Differential   Screening for thyroid disorder       Relevant Orders   TSH   Encounter for lipid screening for cardiovascular disease       Relevant Orders   Lipid panel  Screening for diabetes mellitus       Relevant Orders   Hemoglobin [id number]   Basic Metabolic Panel (BMET)   Breast cancer screening by mammogram       Relevant Orders   MM DIGITAL SCREENING BILATERAL      1) Mammogram - recommend yearly screening mammogram.  Mammogram Was ordered today  2) STI screening  was offered and declined  3) ASCCP guidelines and rational discussed.  Patient opts for every 5 years screening interval  4) Routine healthcare maintenance including cholesterol, diabetes screening discussed To return fasting at a later date  5) Colonoscopy patient believes she may of had this done before. She will check.  Discussed recommendation for colon cancer screening.  Screening recommended starting at age 84 for average risk individuals, age 100 for individuals deemed at increased risk (including African Americans) and recommended to continue until age 47.  For patient age 64-85 individualized approach is recommended.  Gold standard screening is via colonoscopy, Cologuard screening is an acceptable alternative for patient unwilling or unable to undergo colonoscopy.  "Colorectal cancer screening for average?risk  adults: 2018 guideline update from the [place]: A Cancer Journal for Clinicians: Dec 19, 2016   6) IUD strings lost, bulky enlarged uterus, follow up for [place].   7) Return in about 2 weeks (around 09/18/2019) for GYN visit and [place] and fasting labs.    Adrian Prows MD [facility], [facility] 09/04/2019 5:50 PM

## 2019-09-04 NOTE — Patient Instructions (Signed)
Institute of Medicine Recommended Dietary Allowances for Calcium and Vitamin D  Age (yr) Calcium Recommended Dietary Allowance (mg/day) Vitamin D Recommended Dietary Allowance (international units/day)  9-18 1,300 600  19-50 1,000 600  51-70 1,200 600  71 and older 1,200 800  Data from Institute of Medicine. Dietary reference intakes: calcium, vitamin D. [place], [place]: National Academies Press; 2011.      Exercising to Stay Healthy To become healthy and stay healthy, it is recommended that you do moderate-intensity and vigorous-intensity exercise. You can tell that you are exercising at a moderate intensity if your heart starts beating faster and you start breathing faster but can still hold a conversation. You can tell that you are exercising at a vigorous intensity if you are breathing much harder and faster and cannot hold a conversation while exercising. Exercising regularly is important. It has many health benefits, such as:  Improving overall fitness, flexibility, and endurance.  Increasing bone density.  Helping with weight control.  Decreasing body fat.  Increasing muscle strength.  Reducing stress and tension.  Improving overall health. How often should I exercise? Choose an activity that you enjoy, and set realistic goals. Your health care provider can help you make an activity plan that works for you. Exercise regularly as told by your health care provider. This may include:  Doing strength training two times a week, such as: ? Lifting weights. ? Using resistance bands. ? Push-ups. ? Sit-ups. ? Yoga.  Doing a certain intensity of exercise for a given amount of time. Choose from these options: ? A total of 150 minutes of moderate-intensity exercise every week. ? A total of 75 minutes of vigorous-intensity exercise every week. ? A mix of moderate-intensity and vigorous-intensity exercise every week. Children, pregnant women, people who have not  exercised regularly, people who are overweight, and older adults may need to talk with a health care provider about what activities are safe to do. If you have a medical condition, be sure to talk with your health care provider before you start a new exercise program. What are some exercise ideas? Moderate-intensity exercise ideas include:  Walking 1 mile (1.6 km) in about 15 minutes.  Biking.  Hiking.  Golfing.  Dancing.  Water aerobics. Vigorous-intensity exercise ideas include:  Walking 4.5 miles (7.2 km) or more in about 1 hour.  Jogging or running 5 miles (8 km) in about 1 hour.  Biking 10 miles (16.1 km) or more in about 1 hour.  Lap swimming.  Roller-skating or in-line skating.  Cross-country skiing.  Vigorous competitive sports, such as football, basketball, and soccer.  Jumping rope.  Aerobic dancing. What are some everyday activities that can help me to get exercise?  Yard work, such as: ? Pushing a lawn mower. ? Raking and bagging leaves.  Washing your car.  Pushing a stroller.  Shoveling snow.  Gardening.  Washing windows or floors. How can I be more active in my day-to-day activities?  Use stairs instead of an elevator.  Take a walk during your lunch break.  If you drive, park your car farther away from your work or school.  If you take public transportation, get off one stop early and walk the rest of the way.  Stand up or walk around during all of your indoor phone calls.  Get up, stretch, and walk around every 30 minutes throughout the day.  Enjoy exercise with a friend. Support to continue exercising will help you keep a regular routine of activity. What guidelines   can I follow while exercising?  Before you start a new exercise program, talk with your health care provider.  Do not exercise so much that you hurt yourself, feel dizzy, or get very short of breath.  Wear comfortable clothes and wear shoes with good support.  Drink  plenty of water while you exercise to prevent dehydration or heat stroke.  Work out until your breathing and your heartbeat get faster. Where to find more information  U.S. Department of Health and Human Services: www.hhs.gov  Centers for Disease Control and Prevention (CDC): www.cdc.gov Summary  Exercising regularly is important. It will improve your overall fitness, flexibility, and endurance.  Regular exercise also will improve your overall health. It can help you control your weight, reduce stress, and improve your bone density.  Do not exercise so much that you hurt yourself, feel dizzy, or get very short of breath.  Before you start a new exercise program, talk with your health care provider. This information is not intended to replace advice given to you by your health care provider. Make sure you discuss any questions you have with your health care provider. Document Revised: 06/21/2017 Document Reviewed: 05/30/2017 Elsevier Patient Education  2020 Elsevier Inc.  Budget-Friendly Healthy Eating There are many ways to save money at the grocery store and continue to eat healthy. You can be successful if you:  Plan meals according to your budget.  Make a grocery list and only purchase food according to your grocery list.  Prepare food yourself. What are tips for following this plan?  Reading food labels  Compare food labels between brand name foods and the store brand. Often the nutritional value is the same, but the store brand is lower cost.  Look for products that do not have added sugar, fat, or salt (sodium). These often cost the same but are healthier for you. Products may be labeled as: ? Sugar-free. ? Nonfat. ? Low-fat. ? Sodium-free. ? Low-sodium.  Look for lean ground beef labeled as at least 92% lean and 8% fat. Shopping  Buy only the items on your grocery list and go only to the areas of the store that have the items on your list.  Use coupons only for  foods and brands you normally buy. Avoid buying items you wouldn't normally buy simply because they are on sale.  Check online and in newspapers for weekly deals.  Buy healthy items from the bulk bins when available, such as herbs, spices, flour, pasta, nuts, and dried fruit.  Buy fruits and vegetables that are in season. Prices are usually lower on in-season produce.  Look at the unit price on the price tag. Use it to compare different brands and sizes to find out which item is the best deal.  Choose healthy items that are often low-cost, such as carrots, potatoes, apples, bananas, and oranges. Dried or canned beans are a low-cost protein source.  Buy in bulk and freeze extra food. Items you can buy in bulk include meats, fish, poultry, frozen fruits, and frozen vegetables.  Avoid buying "ready-to-eat" foods, such as pre-cut fruits and vegetables and pre-made salads.  If possible, shop around to discover where you can find the best prices. Consider other retailers such as dollar stores, larger wholesale stores, local fruit and vegetable stands, and farmers markets.  Do not shop when you are hungry. If you shop while hungry, it may be hard to stick to your list and budget.  Resist impulse buying. Use your grocery list as   your official plan for the week.  Buy a variety of vegetables and fruits by purchasing fresh, frozen, and canned items.  Look at the top and bottom shelves for deals. Foods at eye level (eye level of an adult or child) are usually more expensive.  Be efficient with your time when shopping. The more time you spend at the store, the more money you are likely to spend.  To save money when choosing more expensive foods like meats and dairy: ? Choose cheaper cuts of meat, such as bone-in chicken thighs and drumsticks instead of skinless and boneless chicken. When you are ready to prepare the chicken, you can remove the skin yourself to make it healthier. ? Choose lean meats  like chicken or turkey instead of beef. ? Choose canned seafood, such as tuna, salmon, or sardines. ? Buy eggs as a low-cost source of protein. ? Buy dried beans and peas, such as lentils, split peas, or kidney beans instead of meats. Dried beans and peas are a good alternative source of protein. ? Buy the larger tubs of yogurt instead of individual-sized containers.  Choose water instead of sodas and other sweetened beverages.  Avoid buying chips, cookies, and other "junk food." These items are usually expensive and not healthy. Cooking  Make extra food and freeze the extras in meal-sized containers or in individual portions for fast meals and snacks.  Pre-cook on days when you have extra time to prepare meals in advance. You can keep these meals in the fridge or freezer and reheat for a quick meal.  When you come home from the grocery store, wash, peel, and cut fruits and vegetables so they are ready to use and eat. This will help reduce food waste. Meal planning  Do not eat out or get fast food. Prepare food at home.  Make a grocery list and make sure to bring it with you to the store. If you have a smart phone, you could use your phone to create your shopping list.  Plan meals and snacks according to a grocery list and budget you create.  Use leftovers in your meal plan for the week.  Look for recipes where you can cook once and make enough food for two meals.  Include budget-friendly meals like stews, casseroles, and stir-fry dishes.  Try some meatless meals or try "no cook" meals like salads.  Make sure that half your plate is filled with fruits or vegetables. Choose from fresh, frozen, or canned fruits and vegetables. If eating canned, remember to rinse them before eating. This will remove any excess salt added for packaging. Summary  Eating healthy on a budget is possible if you plan your meals according to your budget, purchase according to your budget and grocery list,  and prepare food yourself.  Tips for buying more food on a limited budget include buying generic brands, using coupons only for foods you normally buy, and buying healthy items from the bulk bins when available.  Tips for buying cheaper food to replace expensive food include choosing cheaper, lean cuts of meat, and buying dried beans and peas. This information is not intended to replace advice given to you by your health care provider. Make sure you discuss any questions you have with your health care provider. Document Revised: 07/10/2017 Document Reviewed: 07/10/2017 Elsevier Patient Education  2020 Elsevier Inc.  

## 2019-09-14 ENCOUNTER — Ambulatory Visit
Admission: RE | Admit: 2019-09-14 | Discharge: 2019-09-14 | Disposition: A | Discharge status: Home / Self Care | Payer: BC Managed Care – PPO | Source: Ambulatory Visit | Attending: Physician Assistant | Admitting: Physician Assistant

## 2019-09-14 DIAGNOSIS — H9209 Otalgia, unspecified ear: Secondary | ICD-10-CM

## 2019-09-14 MED ORDER — IOPAMIDOL (ISOVUE-300) INJECTION 61%
75.0000 mL | Freq: Once | INTRAVENOUS | Status: AC | PRN
  Administered 2019-09-14: 12:00:00 75 mL via INTRAVENOUS

## 2019-09-21 ENCOUNTER — Other Ambulatory Visit: Payer: Self-pay | Admitting: Physician Assistant

## 2019-09-21 DIAGNOSIS — D37030 Neoplasm of uncertain behavior of the parotid salivary glands: Secondary | ICD-10-CM

## 2019-09-24 NOTE — Discharge Instructions (Signed)
Needle Biopsy, Care After This sheet gives you information about how to care for yourself after your procedure. Your health care provider may also give you more specific instructions. If you have problems or questions, contact your health care provider. What can I expect after the procedure? After the procedure, it is common to have soreness, bruising, or mild pain at the puncture site. This should go away in a few days. Follow these instructions at home: Needle insertion site care   Wash your hands with soap and water before you change your bandage (dressing). If you cannot use soap and water, use hand sanitizer.  Follow instructions from your health care provider about how to take care of your puncture site. This includes: ? When and how to change your dressing. ? When to remove your dressing.  Check your puncture site every day for signs of infection. Check for: ? Redness, swelling, or pain. ? Fluid or blood. ? Pus or a bad smell. ? Warmth. General instructions  Return to your normal activities as told by your health care provider. Ask your health care provider what activities are safe for you.  Do not take baths, swim, or use a hot tub until your health care provider approves. Ask your health care provider if you may take showers. You may only be allowed to take sponge baths.  Take over-the-counter and prescription medicines only as told by your health care provider.  Keep all follow-up visits as told by your health care provider. This is important. Contact a health care provider if:  You have a fever.  You have redness, swelling, or pain at the puncture site that lasts longer than a few days.  You have fluid, blood, or pus coming from your puncture site.  Your puncture site feels warm to the touch. Get help right away if:  You have severe bleeding from the puncture site. Summary  After the procedure, it is common to have soreness, bruising, or mild pain at the puncture  site. This should go away in a few days.  Check your puncture site every day for signs of infection, such as redness, swelling, or pain.  Get help right away if you have severe bleeding from your puncture site. This information is not intended to replace advice given to you by your health care provider. Make sure you discuss any questions you have with your health care provider. Document Revised: 09/20/2017 Document Reviewed: 07/22/2017 Elsevier Patient Education  2020 Elsevier Inc.  

## 2019-09-28 ENCOUNTER — Encounter: Payer: Self-pay | Admitting: Obstetrics and Gynecology

## 2019-09-28 ENCOUNTER — Ambulatory Visit (INDEPENDENT_AMBULATORY_CARE_PROVIDER_SITE_OTHER): Payer: BC Managed Care – PPO

## 2019-09-28 ENCOUNTER — Other Ambulatory Visit: Payer: Self-pay

## 2019-09-28 ENCOUNTER — Ambulatory Visit (INDEPENDENT_AMBULATORY_CARE_PROVIDER_SITE_OTHER): Payer: BC Managed Care – PPO | Admitting: Obstetrics and Gynecology

## 2019-09-28 VITALS — BP 100/58 | Ht 62.0 in | Wt 145.0 lb

## 2019-09-28 DIAGNOSIS — N852 Hypertrophy of uterus: Secondary | ICD-10-CM

## 2019-09-28 DIAGNOSIS — Z131 Encounter for screening for diabetes mellitus: Secondary | ICD-10-CM

## 2019-09-28 DIAGNOSIS — T8332XD Displacement of intrauterine contraceptive device, subsequent encounter: Secondary | ICD-10-CM | POA: Diagnosis not present

## 2019-09-28 DIAGNOSIS — Z130 Encounter for screening for diseases of the blood and blood-forming organs and certain disorders involving the immune mechanism: Secondary | ICD-10-CM

## 2019-09-28 DIAGNOSIS — T8332XA Displacement of intrauterine contraceptive device, initial encounter: Secondary | ICD-10-CM | POA: Diagnosis not present

## 2019-09-28 DIAGNOSIS — Z13220 Encounter for screening for lipoid disorders: Secondary | ICD-10-CM

## 2019-09-28 DIAGNOSIS — N83292 Other ovarian cyst, left side: Secondary | ICD-10-CM | POA: Diagnosis not present

## 2019-09-28 DIAGNOSIS — Z0000 Encounter for general adult medical examination without abnormal findings: Secondary | ICD-10-CM

## 2019-09-28 DIAGNOSIS — Z1329 Encounter for screening for other suspected endocrine disorder: Secondary | ICD-10-CM

## 2019-09-28 NOTE — Progress Notes (Signed)
Patient ID: Connie Charles, female   DOB: 03/15/1966, 54 y.o.   MRN: [id number]  Reason for Consult: Follow-up (U/S follow up )   Referred by Medicine, Arletha Pili*  Subjective:     HPI:  Connie Charles is a 54 y.o. female she is here today to follow-up for last IUD strings.  She reports that she recently had her period which was 1 day of bleeding and 3 days of light bleeding.  First day of last menstrual period was 09/24/2019.    Past Medical History:  Diagnosis Date  . Anemia   . Dry eyes, bilateral    Family History  Problem Relation Age of Onset  . Thyroid disease Mother   . Breast cancer Neg Hx    Past Surgical History:  Procedure Laterality Date  . AUGMENTATION MAMMAPLASTY Bilateral 10/2015   put in 10 yrs ago , replaced in 10/2015  . BREAST SURGERY     BREAST AUGMENTATION  . CHOLECYSTECTOMY    . LYMPH NODE BIOPSY Right 12/15/2014   Procedure: LYMPH NODE BIOPSY;  Surgeon: Clyde Canterbury, MD;  Location: [facility] ORS;  Service: ENT;  Laterality: Right;    Short Social History:  Social History   Tobacco Use  . Smoking status: Never Smoker  . Smokeless tobacco: Never Used  Substance Use Topics  . Alcohol use: No    Allergies  Allergen Reactions  . Buspirone     Other reaction(s): Headache    Current Outpatient Medications  Medication Sig Dispense Refill  . azelastine (ASTELIN) 0.1 % nasal spray Place 2 sprays into both nostrils 2 (two) times daily.    . Multiple Vitamin (MULTI-VITAMIN) tablet Take by mouth.    Marland Kitchen XIIDRA 5 % SOLN      Current Facility-Administered Medications  Medication Dose Route Frequency Provider Last Rate Last Admin  . levonorgestrel (MIRENA) 20 MCG/24HR IUD   Intrauterine Once Copland, Alicia B, PA-C        Review of Systems  Constitutional: Negative for chills, fatigue, fever and unexpected weight change.  HENT: Negative for trouble swallowing.  Eyes: Negative for loss of vision.  Respiratory: Negative for cough, shortness of  breath and wheezing.  Cardiovascular: Negative for chest pain, leg swelling, palpitations and syncope.  GI: Negative for abdominal pain, blood in stool, diarrhea, nausea and vomiting.  GU: Negative for difficulty urinating, dysuria, frequency and hematuria.  Musculoskeletal: Negative for back pain, leg pain and joint pain.  Skin: Negative for rash.  Neurological: Negative for dizziness, headaches, light-headedness, numbness and seizures.  Psychiatric: Negative for behavioral problem, confusion, depressed mood and sleep disturbance.        Objective:  Objective   Vitals:   09/28/19 0934  BP: (!) 100/58  Weight: 145 lb (65.8 kg)  Height: 5\' 2"  (1.575 m)   Body mass index is 26.52 kg/m.  Physical Exam Vitals and nursing note reviewed.  Constitutional:      Appearance: She is well-developed.  HENT:     Head: Normocephalic and atraumatic.  Eyes:     Pupils: Pupils are equal, round, and reactive to light.  Cardiovascular:     Rate and Rhythm: Normal rate and regular rhythm.  Pulmonary:     Effort: Pulmonary effort is normal. No respiratory distress.  Skin:    General: Skin is warm and dry.  Neurological:     Mental Status: She is alert and oriented to person, place, and time.  Psychiatric:  Behavior: Behavior normal.        Thought Content: Thought content normal.        Judgment: Judgment normal.        Assessment/Plan:     54 year old with lost IUD strings IUD visualized on today's pelvic ultrasound-no further follow-up needed. Normal 3 cm ovulatory cyst seen on [place].  No concerns, No follow-up needed. Patient will have fasting lab work today.  More than 10 minutes were spent face to face with the patient in the room with more than 50% of the time spent providing counseling and discussing the plan of management.  We discussed the [place] results.     Adrian Prows MD [facility], [facility] 09/28/2019 9:50 AM

## 2019-09-29 ENCOUNTER — Other Ambulatory Visit: Payer: Self-pay | Admitting: Radiology

## 2019-09-29 LAB — TSH: TSH: 2.17 u[IU]/mL (ref 0.450–4.500)

## 2019-09-29 LAB — BASIC METABOLIC PANEL
BUN/Creatinine Ratio: 15 (ref 9–23)
BUN: 9 mg/dL (ref 6–24)
CO2: 21 mmol/L (ref 20–29)
Calcium: 9.1 mg/dL (ref 8.7–10.2)
Chloride: 102 mmol/L (ref 96–106)
Creatinine, Ser: 0.6 mg/dL (ref 0.57–1.00)
GFR calc Af Amer: 122 mL/min/{1.73_m2} (ref >59)
GFR calc non Af Amer: 106 mL/min/{1.73_m2} (ref >59)
Glucose: 89 mg/dL (ref 65–99)
Potassium: 4.5 mmol/L (ref 3.5–5.2)
Sodium: 137 mmol/L (ref 134–144)

## 2019-09-29 LAB — LIPID PANEL
Chol/HDL Ratio: 3.8 ratio (ref 0.0–4.4)
Cholesterol, Total: 198 mg/dL (ref 100–199)
HDL: 52 mg/dL (ref >39)
LDL Chol Calc (NIH): 116 mg/dL — ABNORMAL HIGH (ref 0–99)
Triglycerides: 171 mg/dL — ABNORMAL HIGH (ref 0–149)
VLDL Cholesterol Cal: 30 mg/dL (ref 5–40)

## 2019-09-29 LAB — CBC WITH DIFFERENTIAL
Basophils Absolute: 0.1 10*3/uL (ref 0.0–0.2)
Basos: 1 %
EOS (ABSOLUTE): 0.1 10*3/uL (ref 0.0–0.4)
Eos: 2 %
Hematocrit: 45 % (ref 34.0–46.6)
Hemoglobin: 14.7 g/dL (ref 11.1–15.9)
Immature Grans (Abs): 0 10*3/uL (ref 0.0–0.1)
Immature Granulocytes: 0 %
Lymphocytes Absolute: 2.3 10*3/uL (ref 0.7–3.1)
Lymphs: 32 %
MCH: 30.4 pg (ref 26.6–33.0)
MCHC: 32.7 g/dL (ref 31.5–35.7)
MCV: 93 fL (ref 79–97)
Monocytes Absolute: 0.5 10*3/uL (ref 0.1–0.9)
Monocytes: 7 %
Neutrophils Absolute: 4.2 10*3/uL (ref 1.4–7.0)
Neutrophils: 58 %
RBC: 4.84 x10E6/uL (ref 3.77–5.28)
RDW: 12.4 % (ref 11.7–15.4)
WBC: 7.2 10*3/uL (ref 3.4–10.8)

## 2019-09-29 LAB — HEMOGLOBIN A1C
Est. average glucose Bld gHb Est-mCnc: 108 mg/dL
Hgb A1c MFr Bld: 5.4 % (ref 4.8–5.6)

## 2019-09-30 ENCOUNTER — Other Ambulatory Visit: Payer: Self-pay

## 2019-09-30 ENCOUNTER — Ambulatory Visit
Admission: RE | Admit: 2019-09-30 | Discharge: 2019-09-30 | Disposition: A | Discharge status: Home / Self Care | Payer: BC Managed Care – PPO | Source: Ambulatory Visit | Attending: Physician Assistant | Admitting: Physician Assistant

## 2019-09-30 DIAGNOSIS — D37030 Neoplasm of uncertain behavior of the parotid salivary glands: Secondary | ICD-10-CM

## 2019-09-30 NOTE — Discharge Instructions (Signed)
Needle Biopsy, Care After This sheet gives you information about how to care for yourself after your procedure. Your health care provider may also give you more specific instructions. If you have problems or questions, contact your health care provider. What can I expect after the procedure? After the procedure, it is common to have soreness, bruising, or mild pain at the puncture site. This should go away in a few days. Follow these instructions at home: Needle insertion site care   Wash your hands with soap and water before you change your bandage (dressing). If you cannot use soap and water, use hand sanitizer.  Follow instructions from your health care provider about how to take care of your puncture site. This includes: ? When and how to change your dressing. ? When to remove your dressing.  Check your puncture site every day for signs of infection. Check for: ? Redness, swelling, or pain. ? Fluid or blood. ? Pus or a bad smell. ? Warmth. General instructions  Return to your normal activities as told by your health care provider. Ask your health care provider what activities are safe for you.  Do not take baths, swim, or use a hot tub until your health care provider approves. Ask your health care provider if you may take showers. You may only be allowed to take sponge baths.  Take over-the-counter and prescription medicines only as told by your health care provider.  Keep all follow-up visits as told by your health care provider. This is important. Contact a health care provider if:  You have a fever.  You have redness, swelling, or pain at the puncture site that lasts longer than a few days.  You have fluid, blood, or pus coming from your puncture site.  Your puncture site feels warm to the touch. Get help right away if:  You have severe bleeding from the puncture site. Summary  After the procedure, it is common to have soreness, bruising, or mild pain at the puncture  site. This should go away in a few days.  Check your puncture site every day for signs of infection, such as redness, swelling, or pain.  Get help right away if you have severe bleeding from your puncture site. This information is not intended to replace advice given to you by your health care provider. Make sure you discuss any questions you have with your health care provider. Document Revised: 09/20/2017 Document Reviewed: 07/22/2017 Elsevier Patient Education  2020 Elsevier Inc.  

## 2019-09-30 NOTE — Procedures (Signed)
Interventional Radiology Procedure Note  Procedure: US guided left intra-parotid nodule biopsy.  Mx FNA. Findings: US shows what looks like typical appearing lymph node.  This corresponds to the location of the lesion on CT, but prior does not appear as typical lymph node.   The targetoid appearance could also be a schwannoma, or other lesion.    Complications: None  Recommendations:  - Ok to shower tomorrow - Do not submerge for 7 days - Routine care   Signed,  Dulcy Fanny. Earleen Newport, DO

## 2019-10-01 LAB — CYTOLOGY - NON PAP

## 2020-05-05 ENCOUNTER — Emergency Department: Payer: BC Managed Care – PPO

## 2020-05-05 ENCOUNTER — Other Ambulatory Visit: Payer: Self-pay

## 2020-05-05 ENCOUNTER — Encounter: Payer: Self-pay | Admitting: Emergency Medicine

## 2020-05-05 ENCOUNTER — Ambulatory Visit: Payer: Self-pay

## 2020-05-05 ENCOUNTER — Emergency Department
Admission: EM | Admit: 2020-05-05 | Discharge: 2020-05-05 | Disposition: A | Discharge status: Home / Self Care | Payer: BC Managed Care – PPO | Attending: Student in an Organized Health Care Education/Training Program | Admitting: Student in an Organized Health Care Education/Training Program

## 2020-05-05 DIAGNOSIS — H579 Unspecified disorder of eye and adnexa: Secondary | ICD-10-CM | POA: Insufficient documentation

## 2020-05-05 DIAGNOSIS — R519 Headache, unspecified: Secondary | ICD-10-CM

## 2020-05-05 LAB — BASIC METABOLIC PANEL
Anion gap: 11 (ref 5–15)
BUN: 9 mg/dL (ref 6–20)
CO2: 23 mmol/L (ref 22–32)
Calcium: 9.1 mg/dL (ref 8.9–10.3)
Chloride: 104 mmol/L (ref 98–111)
Creatinine, Ser: 0.51 mg/dL (ref 0.44–1.00)
GFR, Estimated: >60 mL/min (ref >60)
Glucose, Bld: 85 mg/dL (ref 70–99)
Potassium: 3.5 mmol/L (ref 3.5–5.1)
Sodium: 138 mmol/L (ref 135–145)

## 2020-05-05 MED ORDER — IOHEXOL 350 MG/ML SOLN
75.0000 mL | Freq: Once | INTRAVENOUS | Status: AC | PRN
  Administered 2020-05-05: 75 mL via INTRAVENOUS
  Filled 2020-05-05: qty 75

## 2020-05-05 MED ORDER — KETOROLAC TROMETHAMINE 30 MG/ML IJ SOLN
15.0000 mg | Freq: Once | INTRAMUSCULAR | Status: AC
  Administered 2020-05-05: 15 mg via INTRAVENOUS
  Filled 2020-05-05: qty 1

## 2020-05-05 MED ORDER — AZITHROMYCIN 500 MG PO TABS: 500.0000 mg | ORAL_TABLET | Freq: Every day | ORAL | 0 refills | Status: AC

## 2020-05-05 MED ORDER — SODIUM CHLORIDE 0.9 % IV BOLUS
500.0000 mL | Freq: Once | INTRAVENOUS | Status: AC
  Administered 2020-05-05: 500 mL via INTRAVENOUS

## 2020-05-05 MED ORDER — PROCHLORPERAZINE EDISYLATE 10 MG/2ML IJ SOLN
10.0000 mg | Freq: Once | INTRAMUSCULAR | Status: AC
  Administered 2020-05-05: 10 mg via INTRAVENOUS
  Filled 2020-05-05: qty 2

## 2020-05-05 MED ORDER — DIPHENHYDRAMINE HCL 50 MG/ML IJ SOLN
12.5000 mg | Freq: Once | INTRAMUSCULAR | Status: AC
  Administered 2020-05-05: 12.5 mg via INTRAVENOUS
  Filled 2020-05-05: qty 1

## 2020-05-05 NOTE — ED Notes (Signed)
Pt states intense headache with no relief of medication

## 2020-05-05 NOTE — ED Provider Notes (Signed)
[facility] Emergency Department Provider Note    First MD Initiated Contact with Patient 05/05/20 1357     (approximate)  I have reviewed the triage vital signs and the nursing notes.   HISTORY  Chief Complaint Headache and Eye Problem    HPI Connie Charles is a 54 y.o. female with the below listed past medical history presents to the ER for evaluation of 1 week of frontal headache and puffiness around her eyes and discomfort.  States that she does have a history of migraine headaches but has never had a lasting this long.  Denies any numbness or tingling.  No measured fevers.  No sudden onset headache.  No neck stiffness.  No blurry vision.    Past Medical History:  Diagnosis Date  . Anemia   . Dry eyes, bilateral    Family History  Problem Relation Age of Onset  . Thyroid disease Mother   . Breast cancer Neg Hx    Past Surgical History:  Procedure Laterality Date  . AUGMENTATION MAMMAPLASTY Bilateral 10/2015   put in 10 yrs ago , replaced in 10/2015  . BREAST SURGERY     BREAST AUGMENTATION  . CHOLECYSTECTOMY    . LYMPH NODE BIOPSY Right 12/15/2014   Procedure: LYMPH NODE BIOPSY;  Surgeon: Clyde Canterbury, MD;  Location: [facility] ORS;  Service: ENT;  Laterality: Right;  . SALIVARY GLAND SURGERY Left    Patient Active Problem List   Diagnosis Date Noted  . Edema 12/15/2014      Prior to Admission medications   Medication Sig Start Date End Date Taking? Authorizing Provider  azelastine (ASTELIN) 0.1 % nasal spray Place 2 sprays into both nostrils 2 (two) times daily. 09/22/19   [provider]  azithromycin (ZITHROMAX) 500 MG tablet Take 1 tablet (500 mg total) by mouth daily for 3 days. Take 1 tablet daily for 3 days. 05/05/20 05/08/20  Merlyn Lot, MD  Multiple Vitamin (MULTI-VITAMIN) tablet Take by mouth.    [provider]  Shirley Friar 5 % SOLN  09/04/19   [provider]    Allergies Buspirone    Social  History Social History   Tobacco Use  . Smoking status: Never Smoker  . Smokeless tobacco: Never Used  Vaping Use  . Vaping Use: Never used  Substance Use Topics  . Alcohol use: No  . Drug use: No    Review of Systems Patient denies headaches, rhinorrhea, blurry vision, numbness, shortness of breath, chest pain, edema, cough, abdominal pain, nausea, vomiting, diarrhea, dysuria, fevers, rashes or hallucinations unless otherwise stated above in HPI. ____________________________________________   PHYSICAL EXAM:  VITAL SIGNS: Vitals:   05/05/20 1006 05/05/20 1751  BP: (!) 142/75 (!) 146/55  Pulse: 83 85  Resp: 16 18  Temp: 98.3 F (36.8 C)   SpO2: 98% 97%    Constitutional: Alert and oriented.  Eyes: Conjunctivae are eythematous, no purulence, no proptosis, globes feel soft, eomi, no periorbital erythema.  Head: Atraumatic. Nose: No congestion/rhinnorhea. Mouth/Throat: Mucous membranes are moist.   Neck: No stridor. Painless ROM.  Cardiovascular: Normal rate, regular rhythm. Grossly normal heart sounds.  Good peripheral circulation. Respiratory: Normal respiratory effort.  No retractions. Lungs CTAB. Gastrointestinal: Soft and nontender. No distention. No abdominal bruits. No CVA tenderness. Genitourinary: deferred Musculoskeletal: No lower extremity tenderness nor edema.  No joint effusions. Neurologic:  Normal speech and language. No gross focal neurologic deficits are appreciated. No facial droop Skin:  Skin is warm, dry and  intact. No rash noted. Psychiatric: Mood and affect are normal. Speech and behavior are normal.  ____________________________________________   LABS (all labs ordered are listed, but only abnormal results are displayed)  Results for orders placed or performed during the hospital encounter of 05/05/20 (from the past 24 hour(s))  Basic metabolic panel     Status: None   Collection Time: 05/05/20  3:52 PM  Result Value Ref Range   Sodium 138 135  - 145 mmol/L   Potassium 3.5 3.5 - 5.1 mmol/L   Chloride 104 98 - 111 mmol/L   CO2 23 22 - 32 mmol/L   Glucose, Bld 85 70 - 99 mg/dL   BUN 9 6 - 20 mg/dL   Creatinine, Ser 0.51 0.44 - 1.00 mg/dL   Calcium 9.1 8.9 - 10.3 mg/dL   GFR, Estimated >60 >60 mL/min   Anion gap 11 5 - 15   ____________________________________________  EKG____________________________________________  RADIOLOGY  I personally reviewed all radiographic images ordered to evaluate for the above acute complaints and reviewed radiology reports and findings.  These findings were personally discussed with the patient.  Please see medical record for radiology report.  ____________________________________________   PROCEDURES  Procedure(s) performed:  Procedures    Critical Care performed: no ____________________________________________   INITIAL IMPRESSION / ASSESSMENT AND PLAN / ED COURSE  Pertinent labs & imaging results that were available during my care of the patient were reviewed by me and considered in my medical decision making (see chart for details).   DDX: Tension, cluster, mass, SAH, IPH, meningitis, migraine  Connie Charles is a 55 y.o. who presents to the ED with presentation as described above.  Patient well-appearing nontoxic.  No focal neuro deficits.  Has reassuring exam.  Does have some mild puffiness around the eyes does not appear to be overt preseptal or orbital cellulitis.  Does have some conjunctival erythema that appears consistent with allergic conjunctivitis.  No proptosis.  Ocular exam is reassuring.  Does have history of migraines and recurrent headaches will give migraine cocktail.  CT head does not show any evidence of acute intracranial abnormality.  This not consistent with SAH or meningitis.     CT imaging is reassuring does not show any evidence of CVT.  Her pain is completely resolved after migraine cocktail.  She has no focal neuro deficits.  This point do believe she  stable and appropriate for outpatient follow-up.  Will cover course of antibiotics for sinusitis.  Have discussed with the patient and available family all diagnostics and treatments performed thus far and all questions were answered to the best of my ability. The patient demonstrates understanding and agreement with plan.   The patient was evaluated in Emergency Department today for the symptoms described in the history of present illness. He/she was evaluated in the context of the global COVID-19 pandemic, which necessitated consideration that the patient might be at risk for infection with the SARS-CoV-2 virus that causes COVID-19. Institutional protocols and algorithms that pertain to the evaluation of patients at risk for COVID-19 are in a state of rapid change based on information released by regulatory bodies including the CDC and federal and state organizations. These policies and algorithms were followed during the patient's care in the ED.  As part of my medical decision making, I reviewed the following data within the [place] notes reviewed and incorporated, Labs reviewed, notes from prior ED visits and [place] Controlled Substance Database   ____________________________________________   FINAL  CLINICAL IMPRESSION(S) / ED DIAGNOSES  Final diagnoses:  Bad headache      NEW MEDICATIONS STARTED DURING THIS VISIT:  Discharge Medication List as of 05/05/2020  5:47 PM    START taking these medications   Details  azithromycin (ZITHROMAX) 500 MG tablet Take 1 tablet (500 mg total) by mouth daily for 3 days. Take 1 tablet daily for 3 days., Starting Thu 05/05/2020, Until Sun 05/08/2020, Normal         Note:  This document was prepared using Dragon voice recognition software and may include unintentional dictation errors.    Merlyn Lot, MD 05/05/20 [phone]

## 2020-05-05 NOTE — ED Triage Notes (Signed)
Frontal headache for 3 days.  Has puffiness around eyes as well with no exudate from eyes.  Says the headache got really bad last night,.

## 2020-05-05 NOTE — Discharge Instructions (Addendum)

## 2020-05-12 ENCOUNTER — Ambulatory Visit: Payer: BC Managed Care – PPO

## 2020-05-16 ENCOUNTER — Other Ambulatory Visit: Payer: Self-pay

## 2020-05-16 ENCOUNTER — Ambulatory Visit
Admission: RE | Admit: 2020-05-16 | Discharge: 2020-05-16 | Disposition: A | Discharge status: Home / Self Care | Payer: BC Managed Care – PPO | Source: Ambulatory Visit | Attending: Obstetrics and Gynecology | Admitting: Obstetrics and Gynecology

## 2020-05-16 DIAGNOSIS — Z1231 Encounter for screening mammogram for malignant neoplasm of breast: Secondary | ICD-10-CM | POA: Insufficient documentation

## 2021-02-05 ENCOUNTER — Ambulatory Visit: Payer: Self-pay

## 2021-03-20 ENCOUNTER — Encounter: Payer: Self-pay | Admitting: Obstetrics and Gynecology

## 2021-03-20 ENCOUNTER — Ambulatory Visit (INDEPENDENT_AMBULATORY_CARE_PROVIDER_SITE_OTHER): Payer: BC Managed Care – PPO | Admitting: Obstetrics and Gynecology

## 2021-03-20 ENCOUNTER — Other Ambulatory Visit: Payer: Self-pay

## 2021-03-20 VITALS — BP 124/74 | Ht 61.0 in | Wt 141.0 lb

## 2021-03-20 DIAGNOSIS — N951 Menopausal and female climacteric states: Secondary | ICD-10-CM | POA: Diagnosis not present

## 2021-03-20 DIAGNOSIS — Z124 Encounter for screening for malignant neoplasm of cervix: Secondary | ICD-10-CM | POA: Diagnosis not present

## 2021-03-20 MED ORDER — PAROXETINE HCL 10 MG PO TABS: 10.0000 mg | ORAL_TABLET | Freq: Every day | ORAL | 2 refills | Status: DC

## 2021-03-20 NOTE — Progress Notes (Signed)
Patient ID: Connie Charles, female   DOB: Dec 12, 1965, 55 y.o.   MRN: [id number]  Reason for Consult: Menopause (Menopausal symptoms)   Referred by No ref. provider found  Subjective:     HPI:  Connie Charles is a 55 y.o. female.  She presents today with symptoms of hot lashes.  She reports that her hot flashes have been occurring daily.  She feels hot flashes throughout the day.  They almost been constant.  She feels like she is dripping sweat.  She wakes up at night with hot flashes as well.  She reports that she has been having hot flashes since about January 2022.  She currently has an IUD.  She reports that with the IUD she has not been having any menstrual cycle bleeding which has been a significant improvement.  Gynecological History  No LMP recorded. (Menstrual status: IUD).  Past Medical History:  Diagnosis Date   Anemia    Dry eyes, bilateral    Family History  Problem Relation Age of Onset   Thyroid disease Mother    Breast cancer Neg Hx    Past Surgical History:  Procedure Laterality Date   AUGMENTATION MAMMAPLASTY Bilateral 10/2015   put in 10 yrs ago , replaced in 10/2015   BREAST SURGERY     BREAST AUGMENTATION   CHOLECYSTECTOMY     LYMPH NODE BIOPSY Right 12/15/2014   Procedure: LYMPH NODE BIOPSY;  Surgeon: Clyde Canterbury, MD;  Location: [facility] ORS;  Service: ENT;  Laterality: Right;   SALIVARY GLAND SURGERY Left     Short Social History:  Social History   Tobacco Use   Smoking status: Never   Smokeless tobacco: Never  Substance Use Topics   Alcohol use: No    Allergies  Allergen Reactions   Buspirone     Other reaction(s): Headache    Current Outpatient Medications  Medication Sig Dispense Refill   PARoxetine (PAXIL) 10 MG tablet Take 1 tablet (10 mg total) by mouth daily. 30 tablet 2   azelastine (ASTELIN) 0.1 % nasal spray Place 2 sprays into both nostrils 2 (two) times daily. (Patient not taking: Reported on 03/20/2021)     Multiple Vitamin  (MULTI-VITAMIN) tablet Take by mouth. (Patient not taking: Reported on 03/20/2021)     XIIDRA 5 % SOLN      Current Facility-Administered Medications  Medication Dose Route Frequency Provider Last Rate Last Admin   levonorgestrel (MIRENA) 20 MCG/24HR IUD   Intrauterine Once Copland, Alicia B, PA-C        Review of Systems  Constitutional: Negative for chills, fatigue, fever and unexpected weight change.  HENT: Negative for trouble swallowing.  Eyes: Negative for loss of vision.  Respiratory: Negative for cough, shortness of breath and wheezing.  Cardiovascular: Negative for chest pain, leg swelling, palpitations and syncope.  GI: Negative for abdominal pain, blood in stool, diarrhea, nausea and vomiting.  GU: Negative for difficulty urinating, dysuria, frequency and hematuria.  Musculoskeletal: Negative for back pain, leg pain and joint pain.  Skin: Negative for rash.  Neurological: Negative for dizziness, headaches, light-headedness, numbness and seizures.  Psychiatric: Negative for behavioral problem, confusion, depressed mood and sleep disturbance.       Objective:  Objective   Vitals:   03/20/21 0944  BP: 124/74  Weight: 141 lb (64 kg)  Height: '5\' 1"'$  (1.549 m)   Body mass index is 26.64 kg/m.  Physical Exam Vitals and nursing note reviewed. Exam conducted with a chaperone present.  Constitutional:      Appearance: Normal appearance.  HENT:     Head: Normocephalic and atraumatic.  Eyes:     Extraocular Movements: Extraocular movements intact.     Pupils: Pupils are equal, round, and reactive to light.  Cardiovascular:     Rate and Rhythm: Normal rate and regular rhythm.  Pulmonary:     Effort: Pulmonary effort is normal.     Breath sounds: Normal breath sounds.  Abdominal:     General: Abdomen is flat.     Palpations: Abdomen is soft.  Musculoskeletal:     Cervical back: Normal range of motion.  Skin:    General: Skin is warm and dry.  Neurological:      General: No focal deficit present.     Mental Status: She is alert and oriented to person, place, and time.  Psychiatric:        Behavior: Behavior normal.        Thought Content: Thought content normal.        Judgment: Judgment normal.    Assessment/Plan:    55 year old with vasomotor symptoms of menopause Discussed options of treatment including hormone replacement therapy and Paxil. Will check FSH and estradiol today.  Patient will return to to discuss lab results. Patient is interested in starting Paxil today for vasomotor symptoms management.  Prescription was sent to her pharmacy. Patient's Pap smear is due.  Discussed this with patient.  She would like to follow-up for her Pap smear to be performed.  She has her grandchild present and states that he quit and is not comfortable with a palpable sample.   More than 20 minutes were spent face to face with the patient in the room, reviewing the medical record, labs and images, and coordinating care for the patient. The plan of management was discussed in detail and counseling was provided.   Adrian Prows MD [facility], [facility] 03/20/2021 10:25 AM

## 2021-03-21 LAB — ESTRADIOL: Estradiol: <5 pg/mL

## 2021-03-21 LAB — FOLLICLE STIMULATING HORMONE: FSH: 55.8 m[IU]/mL

## 2021-04-05 DIAGNOSIS — K219 Gastro-esophageal reflux disease without esophagitis: Secondary | ICD-10-CM | POA: Insufficient documentation

## 2021-04-05 DIAGNOSIS — G8929 Other chronic pain: Secondary | ICD-10-CM | POA: Insufficient documentation

## 2021-04-05 DIAGNOSIS — M545 Low back pain, unspecified: Secondary | ICD-10-CM | POA: Insufficient documentation

## 2021-04-11 ENCOUNTER — Other Ambulatory Visit: Payer: Self-pay | Admitting: Obstetrics and Gynecology

## 2021-04-11 DIAGNOSIS — N951 Menopausal and female climacteric states: Secondary | ICD-10-CM

## 2021-04-21 ENCOUNTER — Other Ambulatory Visit (HOSPITAL_COMMUNITY)
Admission: RE | Admit: 2021-04-21 | Discharge: 2021-04-21 | Disposition: A | Discharge status: Home / Self Care | Payer: BC Managed Care – PPO | Source: Ambulatory Visit | Attending: Obstetrics and Gynecology | Admitting: Obstetrics and Gynecology

## 2021-04-21 ENCOUNTER — Encounter: Payer: Self-pay | Admitting: Obstetrics and Gynecology

## 2021-04-21 ENCOUNTER — Ambulatory Visit (INDEPENDENT_AMBULATORY_CARE_PROVIDER_SITE_OTHER): Payer: BC Managed Care – PPO | Admitting: Obstetrics and Gynecology

## 2021-04-21 ENCOUNTER — Other Ambulatory Visit: Payer: Self-pay

## 2021-04-21 VITALS — BP 112/72 | Ht 62.0 in | Wt 140.8 lb

## 2021-04-21 DIAGNOSIS — Z1231 Encounter for screening mammogram for malignant neoplasm of breast: Secondary | ICD-10-CM | POA: Diagnosis not present

## 2021-04-21 DIAGNOSIS — Z124 Encounter for screening for malignant neoplasm of cervix: Secondary | ICD-10-CM | POA: Insufficient documentation

## 2021-04-21 DIAGNOSIS — Z0000 Encounter for general adult medical examination without abnormal findings: Secondary | ICD-10-CM

## 2021-04-21 DIAGNOSIS — Z01419 Encounter for gynecological examination (general) (routine) without abnormal findings: Secondary | ICD-10-CM | POA: Diagnosis not present

## 2021-04-21 DIAGNOSIS — N951 Menopausal and female climacteric states: Secondary | ICD-10-CM | POA: Diagnosis not present

## 2021-04-21 MED ORDER — ESTRADIOL 0.05 MG/24HR TD PTWK: 0.0500 mg | MEDICATED_PATCH | TRANSDERMAL | 12 refills | Status: DC

## 2021-04-21 NOTE — Patient Instructions (Signed)
[facility] [place] for Calcium and Vitamin D  Age (yr) Calcium Recommended Dietary Allowance (mg/day) Vitamin D Recommended Dietary Allowance (international units/day)  9-18 1,300 600  19-50 1,000 600  51-70 1,200 600  71 and older 1,200 800  Data from [employer]. Dietary reference intakes: calcium, vitamin D. [place], [place]: [employer]; 2011.    Hormone replacement therapy is for healthy, peri/postmenopausal women with moderate to severe vasomotor symptoms impacting sleep, quality of life, or ability to function, and who are within 10 years of menopause (or <66 years of age).  Exceptions include women with a history of breast cancer, coronary heart disease (CHD), a previous venous thromboembolic (VTE) event or stroke, active liver disease, or those at high risk for these complications.  Estimated VTE Incidence   54/100,000 per year in women in their 59s  62-122/100,000 per year in women in their 35s  300-400/100,000 per year in women aged 51-80 years.   700/100,000 per year in women in their 67s   Combination estrogen plus progestin hormone therapy (HT) or estrogen therapy (ET) for the management of menopausal symptoms and related disorders is associated with an increased risk of venous thromboembolism. Commonly, a relative increase in risk of twofold to fivefold is cited for HT users.   The use of estrogen alone has been associated with a 1.2-1.5-fold relative risk compared with that of nonusers  Standard recommendations for duration of use are three to five years. However, extended use is sometimes necessary for women with persistent, severe hot flashes.  We no longer use MHT for the prevention of chronic disease (osteoporosis, CHD, or dementia). However, there are some data to suggest that use of estrogen within the first 10 years after clinical menopause may reduce the risks of CHD and mortality.        Exercising to Stay Healthy To become healthy and stay healthy, it is recommended that you do moderate-intensity and vigorous-intensity exercise. You can tell that you are exercising at a moderate intensity if your heart starts beating faster and you start breathing faster but can still hold a conversation. You can tell that you are exercising at a vigorous intensity if you are breathing much harder and faster and cannot hold a conversation while exercising. How can exercise benefit me? Exercising regularly is important. It has many health benefits, such as: Improving overall fitness, flexibility, and endurance. Increasing bone density. Helping with weight control. Decreasing body fat. Increasing muscle strength and endurance. Reducing stress and tension, anxiety, depression, or anger. Improving overall health. What guidelines should I follow while exercising? Before you start a new exercise program, talk with your health care provider. Do not exercise so much that you hurt yourself, feel dizzy, or get very short of breath. Wear comfortable clothes and wear shoes with good support. Drink plenty of water while you exercise to prevent dehydration or heat stroke. Work out until your breathing and your heartbeat get faster (moderate intensity). How often should I exercise? Choose an activity that you enjoy, and set realistic goals. Your health care provider can help you make an activity plan that is individually designed and works best for you. Exercise regularly as told by your health care provider. This may include: Doing strength training two times a week, such as: Lifting weights. Using resistance bands. Push-ups. Sit-ups. Yoga. Doing a certain intensity of exercise for a given amount of time. Choose from these options: A total of 150 minutes of moderate-intensity exercise every week.  A total of 75 minutes of vigorous-intensity exercise every week. A mix of moderate-intensity and  vigorous-intensity exercise every week. Children, pregnant women, people who have not exercised regularly, people who are overweight, and older adults may need to talk with a health care provider about what activities are safe to perform. If you have a medical condition, be sure to talk with your health care provider before you start a new exercise program. What are some exercise ideas? Moderate-intensity exercise ideas include: Walking 1 mile (1.6 km) in about 15 minutes. Biking. Hiking. Golfing. Dancing. Water aerobics. Vigorous-intensity exercise ideas include: Walking 4.5 miles (7.2 km) or more in about 1 hour. Jogging or running 5 miles (8 km) in about 1 hour. Biking 10 miles (16.1 km) or more in about 1 hour. Lap swimming. Roller-skating or in-line skating. Cross-country skiing. Vigorous competitive sports, such as football, basketball, and soccer. Jumping rope. Aerobic dancing. What are some everyday activities that can help me get exercise? Yard work, such as: Psychologist, educational. Raking and bagging leaves. Washing your car. Pushing a stroller. Shoveling snow. Gardening. Washing windows or floors. How can I be more active in my day-to-day activities? Use stairs instead of an elevator. Take a walk during your lunch break. If you drive, park your car farther away from your work or school. If you take public transportation, get off one stop early and walk the rest of the way. Stand up or walk around during all of your indoor phone calls. Get up, stretch, and walk around every 30 minutes throughout the day. Enjoy exercise with a friend. Support to continue exercising will help you keep a regular routine of activity. Where to find more information You can find more information about exercising to stay healthy from: U.S. Department of Health and Human Services: [employer]at Centers for Disease Control and Prevention (CDC): http://www.wolf.info/ Summary Exercising regularly is  important. It will improve your overall fitness, flexibility, and endurance. Regular exercise will also improve your overall health. It can help you control your weight, reduce stress, and improve your bone density. Do not exercise so much that you hurt yourself, feel dizzy, or get very short of breath. Before you start a new exercise program, talk with your health care provider. This information is not intended to replace advice given to you by your health care provider. Make sure you discuss any questions you have with your health care provider. Document Revised: 11/04/2020 Document Reviewed: 11/04/2020 Elsevier Patient Education  2022 [place] Eating There are many ways to save money at the grocery store and continue to eat healthy. You can be successful if you: Plan meals according to your budget. Make a grocery list and only purchase food according to your grocery list. Prepare food yourself at home. What are tips for following this plan? Reading food labels Compare food labels between brand name foods and the store brand. Often the nutritional value is the same, but the store brand is lower cost. Look for products that do not have added sugar, fat, or salt (sodium). These often cost the same but are healthier for you. Products may be labeled as: Sugar-free. Nonfat. Low-fat. Sodium-free. Low-sodium. Look for lean ground beef labeled as at least 92% lean and 8% fat. Shopping  Buy only the items on your grocery list and go only to the areas of the store that have the items on your list. Use coupons only for foods and brands you normally buy. Avoid buying items you wouldn't  normally buy simply because they are on sale. Check online and in newspapers for weekly deals. Buy healthy items from the bulk bins when available, such as herbs, spices, flour, pasta, nuts, and dried fruit. Buy fruits and vegetables that are in season. Prices are usually lower on  in-season produce. Look at the unit price on the price tag. Use it to compare different brands and sizes to find out which item is the best deal. Choose healthy items that are often low-cost, such as carrots, potatoes, apples, bananas, and oranges. Dried or canned beans are a low-cost protein source. Buy in bulk and freeze extra food. Items you can buy in bulk include meats, fish, poultry, frozen fruits, and frozen vegetables. Avoid buying "ready-to-eat" foods, such as pre-cut fruits and vegetables and pre-made salads. If possible, shop around to discover where you can find the best prices. Consider other retailers such as dollar stores, larger [employer], local fruit and vegetable stands, and farmers markets. Do not shop when you are hungry. If you shop while hungry, it may be hard to stick to your list and budget. Resist impulse buying. Use your grocery list as your official plan for the week. Buy a variety of vegetables and fruits by purchasing fresh, frozen, and canned items. Look at the top and bottom shelves for deals. Foods at eye level (eye level of an adult or child) are usually more expensive. Be efficient with your time when shopping. The more time you spend at the store, the more money you are likely to spend. To save money when choosing more expensive foods like meats and dairy: Choose cheaper cuts of meat, such as bone-in chicken thighs and drumsticks instead of skinless and boneless chicken. When you are ready to prepare the chicken, you can remove the skin yourself to make it healthier. Choose lean meats like chicken or [place] instead of beef. Choose canned seafood, such as tuna, salmon, or sardines. Buy eggs as a low-cost source of protein. Buy dried beans and peas, such as lentils, split peas, or kidney beans instead of meats. Dried beans and peas are a good alternative source of protein. Buy the larger tubs of yogurt instead of individual-sized containers. Choose water  instead of sodas and other sweetened beverages. Avoid buying chips, cookies, and other "junk food." These items are usually expensive and not healthy. Cooking Make extra food and freeze the extras in meal-sized containers or in individual portions for fast meals and snacks. Pre-cook on days when you have extra time to prepare meals in advance. You can keep these meals in the fridge or freezer and reheat for a quick meal. When you come home from the grocery store, wash, peel, and cut fruits and vegetables so they are ready to use and eat. This will help reduce food waste. Meal planning Do not eat out or get fast food. Prepare food at home. Make a grocery list and make sure to bring it with you to the store. If you have a smart phone, you could use your phone to create your shopping list. Plan meals and snacks according to a grocery list and budget you create. Use leftovers in your meal plan for the week. Look for recipes where you can cook once and make enough food for two meals. Prepare budget-friendly types of meals like stews, casseroles, and stir-fry dishes. Try some meatless meals or try "no cook" meals like salads. Make sure that half your plate is filled with fruits or vegetables. Choose from fresh,  frozen, or canned fruits and vegetables. If eating canned, remember to rinse them before eating. This will remove any excess salt added for packaging. Summary Eating healthy on a budget is possible if you plan your meals according to your budget, purchase according to your budget and grocery list, and prepare food yourself. Tips for buying more food on a limited budget include buying generic brands, using coupons only for foods you normally buy, and buying healthy items from the bulk bins when available. Tips for buying cheaper food to replace expensive food include choosing cheaper, lean cuts of meat, and buying dried beans and peas. This information is not intended to replace advice given to  you by your health care provider. Make sure you discuss any questions you have with your health care provider. Document Revised: 04/21/2020 Document Reviewed: 04/21/2020 Elsevier Patient Education  [place] protect organs, store calcium, anchor muscles, and support the whole body. Keeping your bones strong is important, especially as you get older. You can take actions to help keep your bones strong and healthy. Why is keeping my bones healthy important? Keeping your bones healthy is important because your body constantly replaces bone cells. Cells get old, and new cells take their place. As we age, we lose bone cells because the body may not be able to make enough new cells to replace the old cells. The amount of bone cells and bone tissue you have is referred to as bone mass. The higher your bone mass, the stronger your bones. The aging process leads to an overall loss of bone mass in the body, which can increase the likelihood of: Joint pain and stiffness. Broken bones. A condition in which the bones become weak and brittle (osteoporosis). A large decline in bone mass occurs in older adults. In women, it occurs about the time of menopause. What actions can I take to keep my bones healthy? Good health habits are important for maintaining healthy bones. This includes eating nutritious foods and exercising regularly. To have healthy bones, you need to get enough of the right minerals and vitamins. Most nutrition experts recommend getting these nutrients from the foods that you eat. In some cases, taking supplements may also be recommended. Doing certain types of exercise is also important for bone health. What are the nutritional recommendations for healthy bones? Eating a well-balanced diet with plenty of calcium and vitamin D will help to protect your bones. Nutritional recommendations vary from person to person. Ask your health care provider what is healthy for you.  Here are some general guidelines. Get enough calcium Calcium is the most important (essential) mineral for bone health. Most people can get enough calcium from their diet, but supplements may be recommended for people who are at risk for osteoporosis. Good sources of calcium include: Dairy products, such as low-fat or nonfat milk, cheese, and yogurt. Dark green leafy vegetables, such as bok choy and broccoli. Calcium-fortified foods, such as orange juice, cereal, bread, soy beverages, and tofu products. Nuts, such as almonds. Follow these recommended amounts for daily calcium intake: Children, age 8-3: 700 mg. Children, age 2-8: 1,000 mg. Children, age 50-13: 1,300 mg. Teens, age 68-18: 1,300 mg. Adults, age 61-50: 1,000 mg. Adults, age 82-70: Men: 1,000 mg. Women: 1,200 mg. Adults, age 60 or older: 1,200 mg. Pregnant and breastfeeding females: Teens: 1,300 mg. Adults: 1,000 mg. Get enough vitamin D Vitamin D is the most essential vitamin for bone health. It helps the body absorb calcium.  Sunlight stimulates the skin to make vitamin D, so be sure to get enough sunlight. If you live in a cold climate or you do not get outside often, your health care provider may recommend that you take vitamin D supplements. Good sources of vitamin D in your diet include: Egg yolks. Saltwater fish. Milk and cereal fortified with vitamin D. Follow these recommended amounts for daily vitamin D intake: Children and teens, age 13-18: 600 international units. Adults, age 25 or younger: 400-800 international units. Adults, age 53 or older: 800-1,000 international units. Get other important nutrients Other nutrients that are important for bone health include: Phosphorus. This mineral is found in meat, poultry, dairy foods, nuts, and legumes. The recommended daily intake for adult men and adult women is 700 mg. Magnesium. This mineral is found in seeds, nuts, dark green vegetables, and legumes. The recommended  daily intake for adult men is 400-420 mg. For adult women, it is 310-320 mg. Vitamin K. This vitamin is found in green leafy vegetables. The recommended daily intake is 120 mg for adult men and 90 mg for adult women. What type of physical activity is best for building and maintaining healthy bones? Weight-bearing and strength-building activities are important for building and maintaining healthy bones. Weight-bearing activities cause muscles and bones to work against gravity. Strength-building activities increase the strength of the muscles that support bones. Weight-bearing and muscle-building activities include: Walking and hiking. Jogging and running. Dancing. Gym exercises. Lifting weights. Tennis and racquetball. Climbing stairs. Aerobics. Adults should get at least 30 minutes of moderate physical activity on most days. Children should get at least 60 minutes of moderate physical activity on most days. Ask your health care provider what type of exercise is best for you. How can I find out if my bone mass is low? Bone mass can be measured with an X-ray test called a bone mineral density (BMD) test. This test is recommended for all women who are age 7 or older. It may also be recommended for: Men who are age 71 or older. People who are at risk for osteoporosis because of: Having bones that break easily. Having a long-term disease that weakens bones, such as kidney disease or rheumatoid arthritis. Having menopause earlier than normal. Taking medicine that weakens bones, such as steroids, thyroid hormones, or hormone treatment for breast cancer or prostate cancer. Smoking. Drinking three or more alcoholic drinks a day. If you find that you have a low bone mass, you may be able to prevent osteoporosis or further bone loss by changing your diet and lifestyle. Where can I find more information? For more information, check out the following websites: [place]:  [employer].fr [employer] of Health: www.bones.SouthExposed.es International Osteoporosis Foundation: Administrator.iofbonehealth.org Summary The aging process leads to an overall loss of bone mass in the body, which can increase the likelihood of broken bones and osteoporosis. Eating a well-balanced diet with plenty of calcium and vitamin D will help to protect your bones. Weight-bearing and strength-building activities are also important for building and maintaining strong bones. Bone mass can be measured with an X-ray test called a bone mineral density (BMD) test. This information is not intended to replace advice given to you by your health care provider. Make sure you discuss any questions you have with your health care provider. Document Revised: 08/05/2017 Document Reviewed: 08/05/2017 Elsevier Patient Education  2022 [employer].

## 2021-04-21 NOTE — Progress Notes (Signed)
Gynecology Annual Exam  PCP: Ezequiel Kayser, MD (Inactive)  Chief Complaint:  Chief Complaint  Patient presents with   Gynecologic Exam    History of Present Illness: Patient is a 55 y.o. [phone] presents for annual exam. The patient has no complaints today.   LMP: No LMP recorded. (Menstrual status: IUD). She denies postmenopausal bleeding or spotting  The patient is sexually active. She denies dyspareunia.  Postcoital Bleeding: no   The patient does perform self breast exams.  There is no notable family history of breast or ovarian cancer in her family.  The patient has regular exercise: walking and weights 5 days a week.  The patient denies current symptoms of depression.   PHQ-9: 0 GAD-7: 0   Review of Systems: Review of Systems  Constitutional:  Negative for chills, fever, malaise/fatigue and weight loss.  HENT:  Negative for congestion, hearing loss and sinus pain.   Eyes:  Negative for blurred vision and double vision.  Respiratory:  Negative for cough, sputum production, shortness of breath and wheezing.   Cardiovascular:  Negative for chest pain, palpitations, orthopnea and leg swelling.  Gastrointestinal:  Negative for abdominal pain, constipation, diarrhea, nausea and vomiting.  Genitourinary:  Negative for dysuria, flank pain, frequency, hematuria and urgency.  Musculoskeletal:  Negative for back pain, falls and joint pain.  Skin:  Negative for itching and rash.  Neurological:  Negative for dizziness and headaches.  Psychiatric/Behavioral:  Negative for depression, substance abuse and suicidal ideas. The patient is not nervous/anxious.    Past Medical History:  Past Medical History:  Diagnosis Date   Anemia    Dry eyes, bilateral     Past Surgical History:  Past Surgical History:  Procedure Laterality Date   AUGMENTATION MAMMAPLASTY Bilateral 10/2015   put in 10 yrs ago , replaced in 10/2015   BREAST SURGERY     BREAST AUGMENTATION   CHOLECYSTECTOMY      LYMPH NODE BIOPSY Right 12/15/2014   Procedure: LYMPH NODE BIOPSY;  Surgeon: Clyde Canterbury, MD;  Location: [facility] ORS;  Service: ENT;  Laterality: Right;   SALIVARY GLAND SURGERY Left     Gynecologic History:  No LMP recorded. (Menstrual status: IUD). Last Pap: Results were: 2019 NIL no abnormalities  Last mammogram: 2021 Results were: BI-RAD I  Obstetric History: [id number]  Family History:  Family History  Problem Relation Age of Onset   Thyroid disease Mother    Breast cancer Neg Hx     Social History:  Social History   Socioeconomic History   Marital status: Married    Spouse name: Not on file   Number of children: Not on file   Years of education: Not on file   Highest education level: Not on file  Occupational History   Not on file  Tobacco Use   Smoking status: Never   Smokeless tobacco: Never  Vaping Use   Vaping Use: Never used  Substance and Sexual Activity   Alcohol use: No   Drug use: No   Sexual activity: Yes    Birth control/protection: I.U.D.    Comment: Mirena   Other Topics Concern   Not on file  Social History Narrative   Not on file   Social Determinants of Health   Financial Resource Strain: Not on file  Food Insecurity: Not on file  Transportation Needs: Not on file  Physical Activity: Not on file  Stress: Not on file  Social Connections: Not on file  Intimate Partner Violence: Not  on file    Allergies:  Allergies  Allergen Reactions   Buspirone     Other reaction(s): Headache    Medications: Prior to Admission medications   Medication Sig Start Date End Date Taking? Authorizing Provider  estradiol (CLIMARA - DOSED IN MG/24 HR) 0.05 mg/24hr patch Place 1 patch (0.05 mg total) onto the skin once a week. 04/21/21  Yes Archibald Marchetta R, MD  PARoxetine (PAXIL) 10 MG tablet TAKE 1 TABLET BY MOUTH EVERY DAY 04/18/21  Yes Cressie Betzler, Stefanie Libel, MD    Physical Exam Vitals: Blood pressure 112/72, height 5\' 2"  (1.575 m), weight 140 lb  12.8 oz (63.9 kg).  Physical Exam Constitutional:      Appearance: She is well-developed.  Genitourinary:     Genitourinary Comments: External: Normal appearing vulva. No lesions noted.  Speculum examination: Normal appearing cervix. No blood in the vaginal vault. NO discharge.   IUD strings seen Bimanual examination: Uterus midline, non-tender, normal in size, shape and contour.  No CMT. No adnexal masses. No adnexal tenderness. Pelvis not fixed.  Breast Exam: exam not performed   HENT:     Head: Normocephalic and atraumatic.  Neck:     Thyroid: No thyromegaly.  Cardiovascular:     Rate and Rhythm: Normal rate and regular rhythm.     Heart sounds: Normal heart sounds.  Pulmonary:     Effort: Pulmonary effort is normal.     Breath sounds: Normal breath sounds.  Abdominal:     General: Bowel sounds are normal. There is no distension.     Palpations: Abdomen is soft. There is no mass.  Musculoskeletal:     Cervical back: Neck supple.  Neurological:     Mental Status: She is alert and oriented to person, place, and time.  Skin:    General: Skin is warm and dry.  Psychiatric:        Behavior: Behavior normal.        Thought Content: Thought content normal.        Judgment: Judgment normal.  Vitals reviewed.     Female chaperone present for pelvic and breast  portions of the physical exam  Assessment: 55 y.o. [phone] routine annual exam  Plan: Problem List Items Addressed This Visit   None Visit Diagnoses     Vasomotor symptoms due to menopause    -  Primary   Relevant Medications   estradiol (CLIMARA - DOSED IN MG/24 HR) 0.05 mg/24hr patch   Healthcare maintenance       Health maintenance examination       Encounter for annual routine gynecological examination       Breast cancer screening by mammogram       Relevant Orders   MM 3D SCREEN BREAST BILATERAL   Cervical cancer screening       Relevant Orders   Cytology - PAP       1) Mammogram - recommend  yearly screening mammogram.  Mammogram Was ordered today  2) STI screening was offered and declined  3) Pap smear performed today  4) Colonoscopy -- last performed in 2019, normal  5) Routine healthcare maintenance including cholesterol, diabetes screening discussed managed by PCP  6) Osteoporosis screening - no increased risk factors.  7) Vasomotor symptoms-hot flashes have improved but not resolved with Paxil.  She is interested in initiation of hormone therapy.  She currently has an IUD which can provide progesterone for her uterus.  We will start patient with estradiol patch.  She  will continue Paxil. Prescription sent.  Follow-up in 2 months.  Adrian Prows MD, [facility], [facility] 04/21/2021 4:05 PM

## 2021-04-25 LAB — CYTOLOGY - PAP
Comment: NEGATIVE
Diagnosis: NEGATIVE
High risk HPV: NEGATIVE

## 2021-05-11 DIAGNOSIS — R7612 Nonspecific reaction to cell mediated immunity measurement of gamma interferon antigen response without active tuberculosis: Secondary | ICD-10-CM | POA: Insufficient documentation

## 2021-05-12 ENCOUNTER — Other Ambulatory Visit: Payer: Self-pay | Admitting: Obstetrics and Gynecology

## 2021-05-12 DIAGNOSIS — N951 Menopausal and female climacteric states: Secondary | ICD-10-CM

## 2021-05-30 ENCOUNTER — Other Ambulatory Visit: Payer: Self-pay

## 2021-05-30 ENCOUNTER — Telehealth: Payer: Self-pay

## 2021-05-30 NOTE — Progress Notes (Signed)
Reconcilling outside records. To be reviewed and confirmed by pt during Epi (due to positive QFT Gold).

## 2021-05-30 NOTE — Telephone Encounter (Signed)
Pt had positive QFT Gold, collected 05/11/2021   Complete Epi  Offer LTBI  Chest x-ray already done 05/23/21.  Get current HIV test, and also order syphilis.

## 2021-05-31 ENCOUNTER — Ambulatory Visit: Payer: Self-pay

## 2021-05-31 VITALS — Ht 62.0 in | Wt 140.0 lb

## 2021-05-31 NOTE — Telephone Encounter (Signed)
Phone call to pt.   Epi completed.  Chest x-ray already done by private dr.  Chest x-ray needs to be scanned in, cannot access the actual result in care everywhere.  Pt is interested in LTBI.  Pt med list updated. Sending request for orders to Dr. Ernestina Patches.

## 2021-05-31 NOTE — Progress Notes (Signed)
The information contained in this document was obtained and reviewed during phone interview with pt.  Pt believes her current wt is approx 140 lbs  Currently has Mirena IUD, was placed 2-3 years ago; also going through menopause.  Currently being evaluated for problem with salivary gland. Dr ordered TB test because her "lymph nodes will not go down"  Pt already had chest x-ray ordered by her doctor. Completed 05/23/21.  Pt is interested in LTBI.  Needs bloodwork: syphilis, updated HIV  Will be sending chart to Dr. Ernestina Patches for review of medications/history and for LTBI orders and other bloodwork if applicable.

## 2021-06-08 ENCOUNTER — Inpatient Hospital Stay: Admission: RE | Admit: 2021-06-08 | Payer: BC Managed Care – PPO | Source: Ambulatory Visit

## 2021-06-12 ENCOUNTER — Other Ambulatory Visit: Payer: Self-pay | Admitting: Family Medicine

## 2021-06-12 NOTE — Progress Notes (Signed)
Tuberculosis treatment orders  All patients are to be monitored per [place] and county TB policies.   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 Treat for latent TB per the following:  Rifampin 600mg  daily by mouth x 4 months, draw LFTs monthly per Dr. Ernestina Patches.  +QFT 05/11/2021 (care everywhere) CXR without Active TB 05/23/2021 ([facility])-see scanned   Offer updated HIV testing at TB med start Please draw baseline CBC w/ diff and LFTs at TB med start appt.   Referred by [facility]  [phone]

## 2021-06-13 ENCOUNTER — Other Ambulatory Visit: Payer: Self-pay

## 2021-06-13 ENCOUNTER — Ambulatory Visit (LOCAL_COMMUNITY_HEALTH_CENTER): Payer: Self-pay

## 2021-06-13 VITALS — Ht 61.0 in | Wt 139.5 lb

## 2021-06-13 DIAGNOSIS — R7612 Nonspecific reaction to cell mediated immunity measurement of gamma interferon antigen response without active tuberculosis: Secondary | ICD-10-CM

## 2021-06-13 MED ORDER — RIFAMPIN 300 MG PO CAPS: 600.0000 mg | ORAL_CAPSULE | Freq: Every day | ORAL | 0 refills | Status: AC

## 2021-06-13 NOTE — Progress Notes (Signed)
In Nurse Clinic for LTBI / TB med start / Rifampin bottle #1. Reports she is currently f-u by [facility] for enlarged R sided cervical and parotid lymph nodes. LTBI Tx consent signed today. Declines HIV / syphilis testing today and HIV declination signed. ROI signed to release info to [facility]. PCP letter faxed successfully to [facility] (Rheumatology). Rifampin 300 mg #60 (bottle #1) dispensed today per order Vertell Novak, MD dated 06/12/2021 with instructions to take 2 pills daily by mouth for 4 months. Rifampin info sheet, Latent vs Active TB resource sheet, and TB Coord. Contact card given and explained to pt. Pt expresses concern about whether Rifampin will affect the meds she takes, especially Paxil. Consult with Hannah Beat, RN (TB coord) who advises that if pt notices a difference in the way she feels after starting the Rifampin, she should notify her PCP about any adjustments that should be made in her meds while taking Rifampin. This RN discussed with pt about contacting PCP if needed about med adjustments and pt in agreement. RN advised pt to contact [facility] if any concerns/questions/ side effects. Next TB med appt scheduled 07/12/2021 with arrival at 10:45am  for 11 am appt. Questions answered and reports understanding. RN walked pt to lab for LFT's and CBC with diff. Josie Saunders, RN

## 2021-06-14 LAB — CBC WITH DIFFERENTIAL/PLATELET
Basophils Absolute: 0.1 10*3/uL (ref 0.0–0.2)
Basos: 1 %
EOS (ABSOLUTE): 0.3 10*3/uL (ref 0.0–0.4)
Eos: 3 %
Hematocrit: 43.9 % (ref 34.0–46.6)
Hemoglobin: 15.1 g/dL (ref 11.1–15.9)
Immature Grans (Abs): 0 10*3/uL (ref 0.0–0.1)
Immature Granulocytes: 0 %
Lymphocytes Absolute: 2.3 10*3/uL (ref 0.7–3.1)
Lymphs: 31 %
MCH: 30.1 pg (ref 26.6–33.0)
MCHC: 34.4 g/dL (ref 31.5–35.7)
MCV: 88 fL (ref 79–97)
Monocytes Absolute: 0.6 10*3/uL (ref 0.1–0.9)
Monocytes: 8 %
Neutrophils Absolute: 4.2 10*3/uL (ref 1.4–7.0)
Neutrophils: 57 %
Platelets: 259 10*3/uL (ref 150–450)
RBC: 5.01 x10E6/uL (ref 3.77–5.28)
RDW: 12 % (ref 11.7–15.4)
WBC: 7.4 10*3/uL (ref 3.4–10.8)

## 2021-06-14 LAB — HEPATIC FUNCTION PANEL
ALT: 18 IU/L (ref 0–32)
AST: 23 IU/L (ref 0–40)
Albumin: 4.7 g/dL (ref 3.8–4.9)
Alkaline Phosphatase: 91 IU/L (ref 44–121)
Bilirubin Total: 0.4 mg/dL (ref 0.0–1.2)
Bilirubin, Direct: <0.1 mg/dL (ref 0.00–0.40)
Total Protein: 7.3 g/dL (ref 6.0–8.5)

## 2021-06-22 ENCOUNTER — Telehealth: Payer: Self-pay

## 2021-06-22 ENCOUNTER — Ambulatory Visit (INDEPENDENT_AMBULATORY_CARE_PROVIDER_SITE_OTHER): Payer: BC Managed Care – PPO | Admitting: Obstetrics and Gynecology

## 2021-06-22 ENCOUNTER — Encounter: Payer: Self-pay | Admitting: Obstetrics and Gynecology

## 2021-06-22 ENCOUNTER — Other Ambulatory Visit: Payer: Self-pay

## 2021-06-22 VITALS — BP 108/70 | Ht 61.0 in | Wt 143.6 lb

## 2021-06-22 DIAGNOSIS — N951 Menopausal and female climacteric states: Secondary | ICD-10-CM

## 2021-06-22 NOTE — Telephone Encounter (Signed)
TC from patient.  C/o headache the last several days after taking Rifampin.  States Tylenol does not help with headaches.  Instructed to eat and take Rifampin in the evenings before bed.  Co may take Tylenol and Advil for headaches.  Patient to f/u with TB RN next week if no better Aileen Fass, RN

## 2021-06-22 NOTE — Progress Notes (Signed)
Patient ID: SEREN CHALOUX, female   DOB: 11-Sep-1965, 55 y.o.   MRN: [id number]  Reason for Consult: Follow-up   Referred by No ref. provider found  Subjective:     HPI:  Talor C Aller is a 55 y.o. female- she is following up today regarding initiation of Paxil for vasomotor symptoms. She has also recently learned that she has latent TB. She has noticed a significant improvement in her perimenopausal symptoms and would like to continue Paxil.   Gynecological History  No LMP recorded. (Menstrual status: IUD).  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[place])    Family History  Problem Relation Age of Onset   Thyroid disease Mother    Breast cancer Neg Hx    Past Surgical History:  Procedure Laterality Date   AUGMENTATION MAMMAPLASTY Bilateral 10/2015   put in 10 yrs ago , replaced in 10/2015   BREAST SURGERY     BREAST AUGMENTATION   CHOLECYSTECTOMY     LYMPH NODE BIOPSY Right 12/15/2014   Procedure: LYMPH NODE BIOPSY;  Surgeon: Clyde Canterbury, MD;  Location: [facility] ORS;  Service: ENT;  Laterality: Right;   SALIVARY GLAND SURGERY Left     Short Social History:  Social History   Tobacco Use   Smoking status: Never   Smokeless tobacco: Never  Substance Use Topics   Alcohol use: Not Currently    Comment: last alcohol was about 6 months ago    Allergies  Allergen Reactions   Buspirone Other (See Comments)    Other reaction(s): Headache Other reaction(s): Headache Other reaction(s): Headache     Current Outpatient Medications  Medication Sig Dispense Refill   estradiol (CLIMARA - DOSED IN MG/24 HR) 0.05 mg/24hr patch PLACE 1 PATCH (0.05 MG TOTAL) ONTO THE SKIN ONCE A WEEK. 12 patch 5   fluticasone (FLONASE) 50 MCG/ACT nasal spray Place into the nose. (Patient not taking: Reported on 08/02/2021)     loratadine-pseudoephedrine (CLARITIN-D 12-HOUR) 5-120 MG tablet Take by mouth.     Multiple Vitamin  (MULTI-VITAMIN) tablet Take 1 tablet by mouth daily.     acetaminophen (TYLENOL) 325 MG tablet Take 325 mg by mouth every 6 (six) hours as needed.     acetaminophen (TYLENOL) 500 MG tablet Take by mouth.     calcium carbonate (TUMS EX) 750 MG chewable tablet Chew by mouth.     hydrocortisone (ANUSOL-HC) 2.5 % rectal cream Apply topically 2 (two) times daily. (Patient not taking: Reported on 05/31/2021)     hydrocortisone 2.5 % cream APPLY TOPICALLY 2 (TWO) TIMES DAILY APPLY AFTER BM (Patient not taking: Reported on 05/31/2021)     lansoprazole (PREVACID) 30 MG capsule Take by mouth. (Patient not taking: Reported on 05/31/2021)     levonorgestrel (MIRENA) 20 MCG/DAY IUD 1 each by Intrauterine route once. Per patient, placed around 2019-2020.     magnesium oxide (MAG-OX) 400 MG tablet Take by mouth.     PARoxetine (PAXIL) 10 MG tablet Take 1 tablet by mouth daily. (Patient not taking: Reported on 05/31/2021)     PARoxetine (PAXIL) 10 MG tablet TAKE 1 TABLET BY MOUTH EVERY DAY 90 tablet 1   predniSONE (DELTASONE) 20 MG tablet Take by mouth. (Patient not taking: Reported on 05/31/2021)     Current Facility-Administered Medications  Medication Dose Route Frequency Provider Last Rate Last Admin   levonorgestrel (MIRENA) 20 MCG/24HR IUD   Intrauterine Once Copland,  Deirdre Evener, PA-C        Review of Systems  Constitutional: Negative for chills, fatigue, fever and unexpected weight change.  HENT: Negative for trouble swallowing.  Eyes: Negative for loss of vision.  Respiratory: Negative for cough, shortness of breath and wheezing.  Cardiovascular: Negative for chest pain, leg swelling, palpitations and syncope.  GI: Negative for abdominal pain, blood in stool, diarrhea, nausea and vomiting.  GU: Negative for difficulty urinating, dysuria, frequency and hematuria.  Musculoskeletal: Negative for back pain, leg pain and joint pain.  Skin: Negative for rash.  Neurological: Negative for dizziness, headaches,  light-headedness, numbness and seizures.  Psychiatric: Negative for behavioral problem, confusion, depressed mood and sleep disturbance.       Objective:  Objective   Vitals:   06/22/21 1014  BP: 108/70  Weight: 143 lb 9.6 oz (65.1 kg)  Height: 5\' 1"  (1.549 m)   Body mass index is 27.13 kg/m.  Physical Exam Vitals and nursing note reviewed. Exam conducted with a chaperone present.  Constitutional:      Appearance: Normal appearance.  HENT:     Head: Normocephalic and atraumatic.  Eyes:     Extraocular Movements: Extraocular movements intact.     Pupils: Pupils are equal, round, and reactive to light.  Cardiovascular:     Rate and Rhythm: Normal rate and regular rhythm.  Pulmonary:     Effort: Pulmonary effort is normal.     Breath sounds: Normal breath sounds.  Abdominal:     General: Abdomen is flat.     Palpations: Abdomen is soft.  Musculoskeletal:     Cervical back: Normal range of motion.  Skin:    General: Skin is warm and dry.  Neurological:     General: No focal deficit present.     Mental Status: She is alert and oriented to person, place, and time.  Psychiatric:        Behavior: Behavior normal.        Thought Content: Thought content normal.        Judgment: Judgment normal.    Assessment/Plan:    55 yo following up regarding vasomotor symptoms Has noticed a significant improvement in her vasomotor symptoms since starting the medication. She plans to continue.  Following up with health department regarding TB treatment.   More than 10 minutes were spent face to face with the patient in the room, reviewing the medical record, labs and images, and coordinating care for the patient. The plan of management was discussed in detail and counseling was provided.      Adrian Prows MD [facility], [facility] 11/06/2021 5:22 PM

## 2021-06-28 DIAGNOSIS — Z227 Latent tuberculosis: Secondary | ICD-10-CM | POA: Insufficient documentation

## 2021-07-03 IMAGING — MG DIGITAL SCREENING BREAST BILAT IMPLANT W/ TOMO W/ CAD
9 of 19 series · 9 of 39 positions shown · non-contrast
Comparison: Previous exam(s).

CLINICAL DATA: Screening.

EXAM:
DIGITAL SCREENING BILATERAL MAMMOGRAM WITH IMPLANTS, CAD AND TOMO
The patient has retropectoral implants. Standard and implant
displaced views were performed.

[L CC]
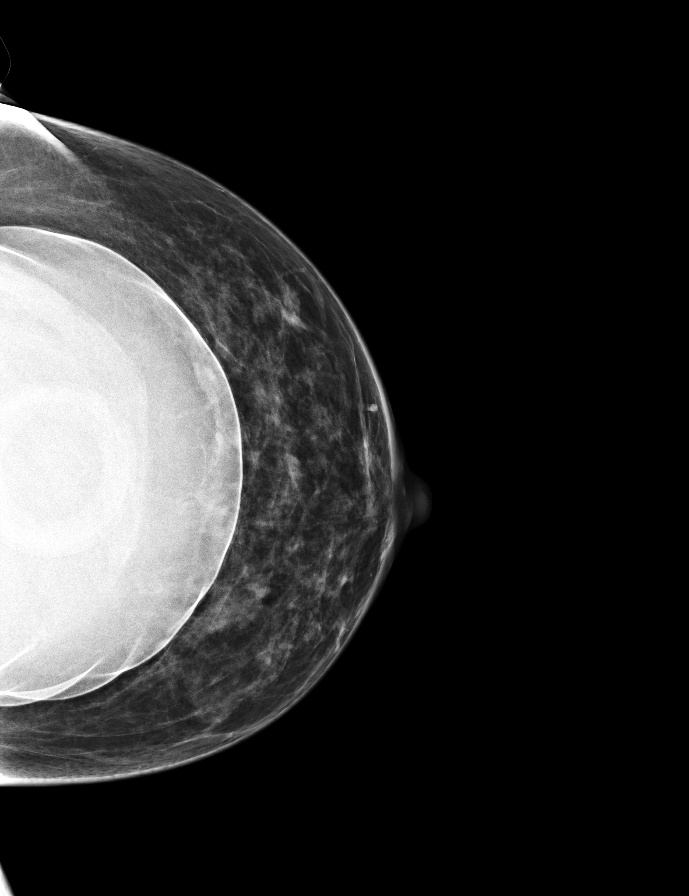

[L MLO (1 of 2)]
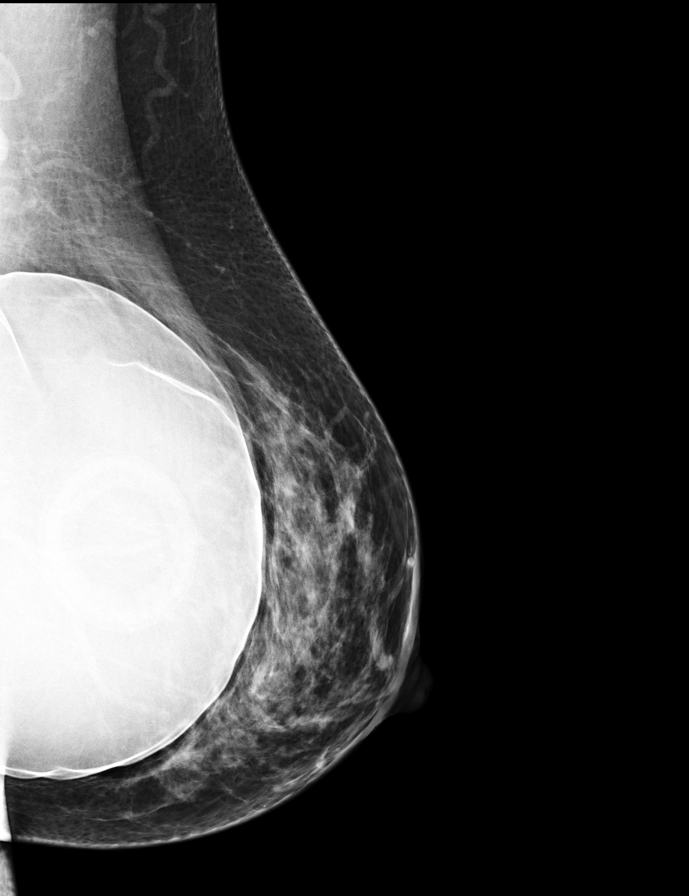

[R CC]
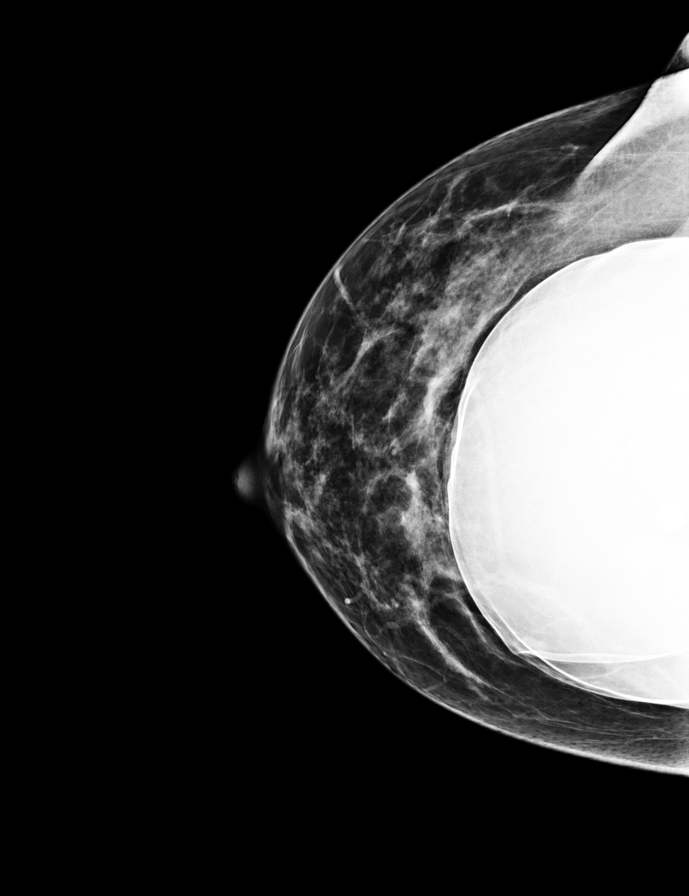

[R MLO (1 of 2)]
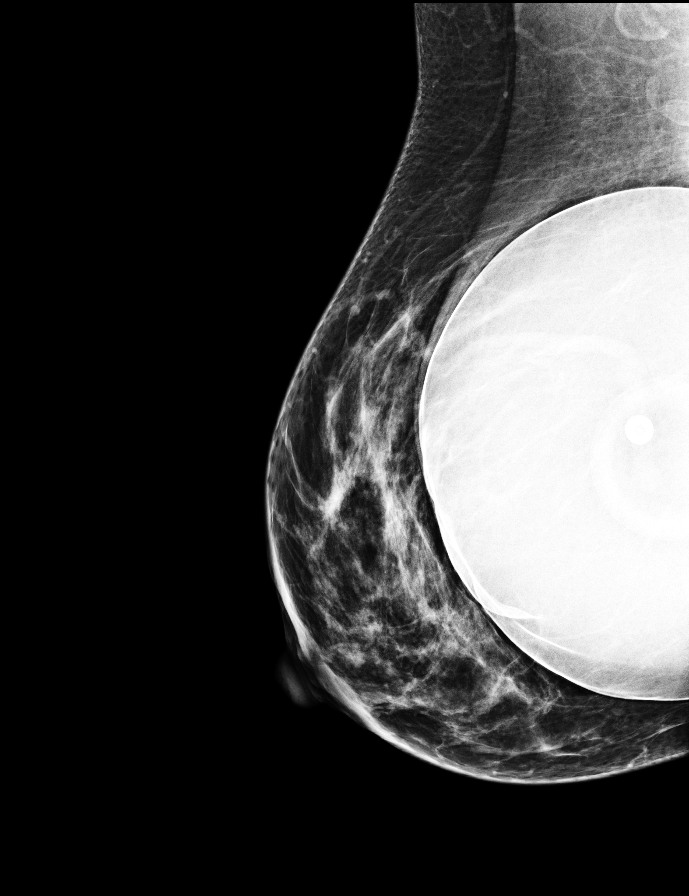

[L MLO (2 of 2)]
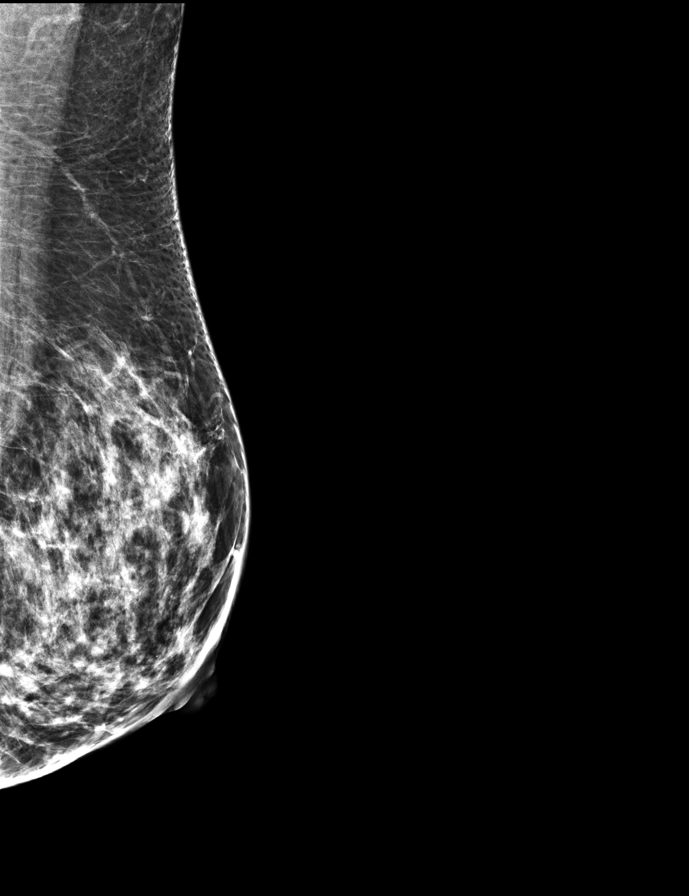

[R MLO (2 of 2)]
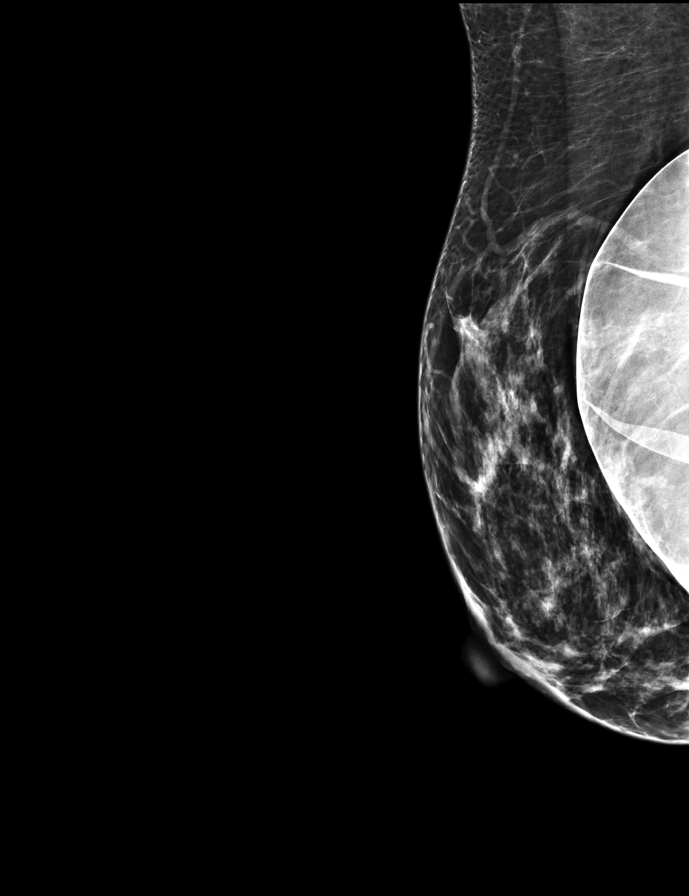

[R MLO synth-2D]
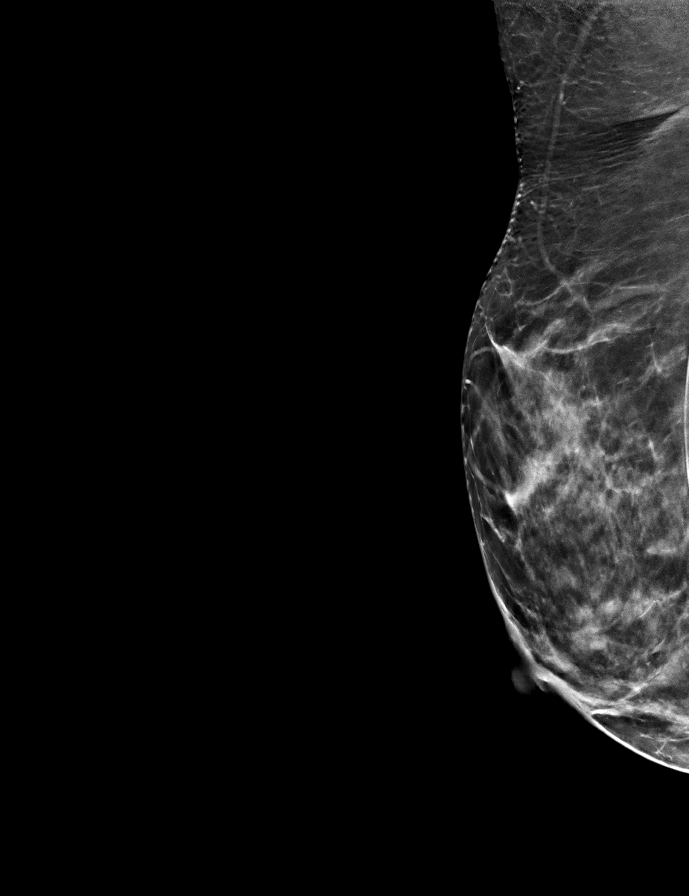

[L MLO synth-2D]
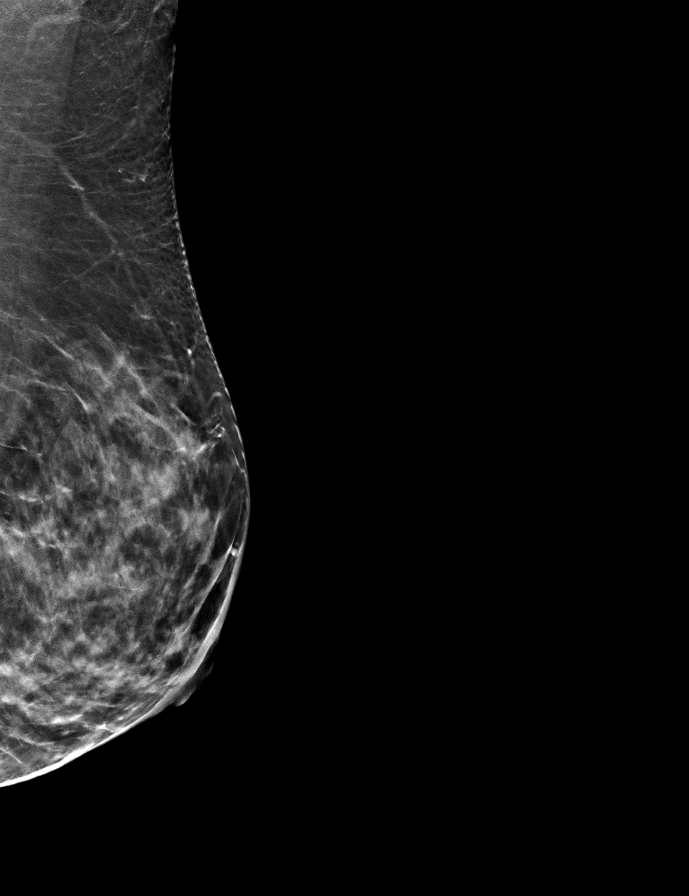

[R CC synth-2D]
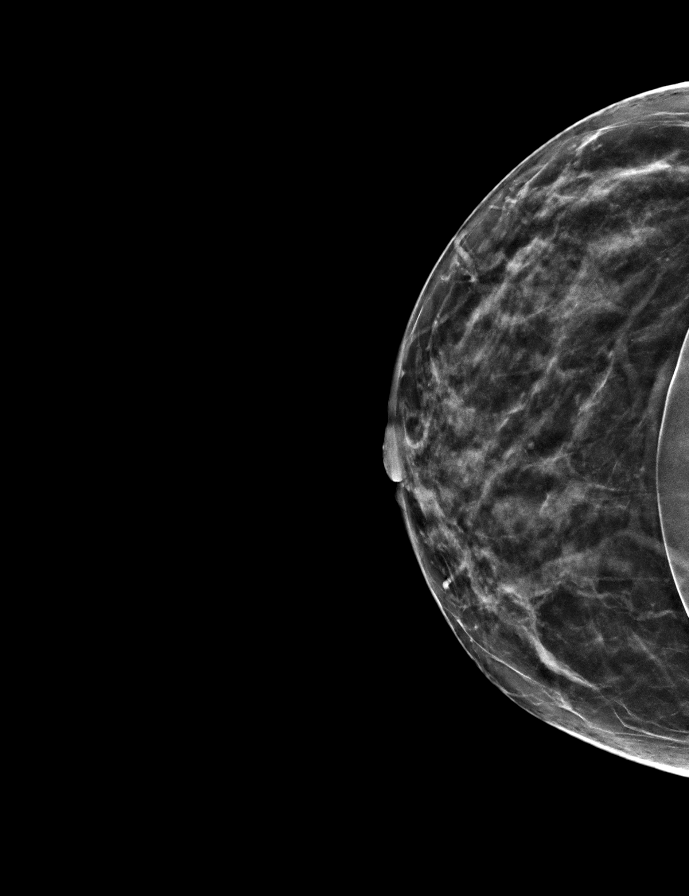

[9 of 39 positions shown; findings below may reference images not displayed]

ACR Breast Density Category c: The breast tissue is heterogeneously
dense, which may obscure small masses.
FINDINGS: There are no findings suspicious for malignancy. Images were
processed with CAD.
IMPRESSION: No mammographic evidence of malignancy. A result letter of this
screening mammogram will be mailed directly to the patient.

RECOMMENDATION:
Screening mammogram in one year. (Code:49-X-OQ9)

BI-RADS CATEGORY  1:  Negative.

## 2021-07-11 ENCOUNTER — Other Ambulatory Visit (HOSPITAL_COMMUNITY): Payer: Self-pay | Admitting: Rheumatology

## 2021-07-11 ENCOUNTER — Other Ambulatory Visit: Payer: Self-pay | Admitting: Rheumatology

## 2021-07-11 DIAGNOSIS — D8984 IgG4-related disease: Secondary | ICD-10-CM | POA: Insufficient documentation

## 2021-07-11 DIAGNOSIS — D8989 Other specified disorders involving the immune mechanism, not elsewhere classified: Secondary | ICD-10-CM

## 2021-07-12 ENCOUNTER — Ambulatory Visit
Admission: RE | Admit: 2021-07-12 | Discharge: 2021-07-12 | Disposition: A | Discharge status: Home / Self Care | Payer: BC Managed Care – PPO | Source: Ambulatory Visit | Attending: Rheumatology | Admitting: Rheumatology

## 2021-07-12 ENCOUNTER — Other Ambulatory Visit: Payer: Self-pay

## 2021-07-12 ENCOUNTER — Ambulatory Visit (LOCAL_COMMUNITY_HEALTH_CENTER): Payer: BC Managed Care – PPO

## 2021-07-12 VITALS — Wt 140.5 lb

## 2021-07-12 DIAGNOSIS — D8989 Other specified disorders involving the immune mechanism, not elsewhere classified: Secondary | ICD-10-CM | POA: Diagnosis not present

## 2021-07-12 DIAGNOSIS — R7612 Nonspecific reaction to cell mediated immunity measurement of gamma interferon antigen response without active tuberculosis: Secondary | ICD-10-CM

## 2021-07-12 MED ORDER — IOHEXOL 300 MG/ML  SOLN
100.0000 mL | Freq: Once | INTRAMUSCULAR | Status: AC | PRN
  Administered 2021-07-12: 10:00:00 100 mL via INTRAVENOUS

## 2021-07-12 MED ORDER — RIFAMPIN 300 MG PO CAPS: 600.0000 mg | ORAL_CAPSULE | Freq: Every day | ORAL | 0 refills | Status: AC

## 2021-07-12 NOTE — Progress Notes (Signed)
Patient in Nurse Clinic for her TB Med visit. Rifampin #2 dispensed today per 06/12/2021 order by Lauretta Chester, MD. No LFT's drawn today due to the order written.  Patient reports that her Immunologist, Dr. Posey Pronto will be starting Steroids in the near future for a Dx of IgG 4 (Autoimmune disorder).  She was instructed to bring her med bottle with her so we could have the name and dose to add to her med list. Next TB Med appointment is 08/02/2021 at 11 am. Card given to patient. Dahlia Bailiff, RN

## 2021-07-25 ENCOUNTER — Other Ambulatory Visit: Payer: Self-pay

## 2021-07-25 ENCOUNTER — Ambulatory Visit
Admission: RE | Admit: 2021-07-25 | Discharge: 2021-07-25 | Disposition: A | Discharge status: Home / Self Care | Payer: BC Managed Care – PPO | Source: Ambulatory Visit | Attending: Obstetrics and Gynecology | Admitting: Obstetrics and Gynecology

## 2021-07-25 DIAGNOSIS — Z1231 Encounter for screening mammogram for malignant neoplasm of breast: Secondary | ICD-10-CM | POA: Insufficient documentation

## 2021-08-02 ENCOUNTER — Ambulatory Visit (LOCAL_COMMUNITY_HEALTH_CENTER): Payer: Self-pay

## 2021-08-02 ENCOUNTER — Other Ambulatory Visit: Payer: Self-pay

## 2021-08-02 VITALS — Wt 142.5 lb

## 2021-08-02 DIAGNOSIS — R7612 Nonspecific reaction to cell mediated immunity measurement of gamma interferon antigen response without active tuberculosis: Secondary | ICD-10-CM

## 2021-08-02 MED ORDER — RIFAMPIN 300 MG PO CAPS: 600.0000 mg | ORAL_CAPSULE | Freq: Every day | ORAL | 0 refills | Status: AC

## 2021-08-02 NOTE — Progress Notes (Signed)
In Nurse Clinic for LTBI / TB med management / Due for Rifampin bottle # 3 today.  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 Surgery to remove Left salivary gland and lymph node. Pt has regular f-u with Dr. Posey Pronto [facility])  Reports taking Rifampin as prescribed and denies missing any pills. Has #21 pills left in current bottle.   Phone consult with Dr Lubertha Sayres and notified of pt's recent dx and meds. Per Dr.Newton, pt to continue TB meds as prescribed.  Rifampin 300 mg #60 dispensed today per Dr Vertell Novak order.  Advised to contact [facility] with questions/concerns/ side effects.  To Lab for LFT's. Next TB med appt /TB med completion / Rifampin bottle #4 scheduled 09/06/2021 at 11 am, pt has reminder. Josie Saunders, RN

## 2021-08-03 LAB — HEPATIC FUNCTION PANEL
ALT: 14 IU/L (ref 0–32)
AST: 17 IU/L (ref 0–40)
Albumin: 4.6 g/dL (ref 3.8–4.9)
Alkaline Phosphatase: 67 IU/L (ref 44–121)
Bilirubin Total: 0.3 mg/dL (ref 0.0–1.2)
Bilirubin, Direct: <0.1 mg/dL (ref 0.00–0.40)
Total Protein: 7.3 g/dL (ref 6.0–8.5)

## 2021-08-17 NOTE — Progress Notes (Signed)
Curator for TB RN: I agree with the care provided to this patient and was available for any consultation.  I was consulted and documentation reflects my recommendations.   Caren Macadam, MD, MPH, ABFM [facility] Medical Director

## 2021-09-06 ENCOUNTER — Ambulatory Visit (LOCAL_COMMUNITY_HEALTH_CENTER): Payer: Self-pay

## 2021-09-06 ENCOUNTER — Other Ambulatory Visit: Payer: Self-pay

## 2021-09-06 VITALS — Wt 147.0 lb

## 2021-09-06 DIAGNOSIS — R7612 Nonspecific reaction to cell mediated immunity measurement of gamma interferon antigen response without active tuberculosis: Secondary | ICD-10-CM

## 2021-09-06 MED ORDER — RIFAMPIN 300 MG PO CAPS: 600.0000 mg | ORAL_CAPSULE | Freq: Every day | ORAL | 0 refills | Status: AC

## 2021-09-06 NOTE — Progress Notes (Addendum)
In Nurse Clinic for LTBI / TB med  Completion. Due for Rifampin #4 today.   Pt reports taking Rifampin as prescribed and admits to missing one day of med during past month. Pt presents  Rifampin bottle #3 and pt has #12 pills ( 6 days) remaining in bottle.   Pt reports more fatigue than last month which she attributes to the meds she is taking for IgG4 autoimmune disorder.   TB Completion letter given and explained.  TB completion Card given. TB coord Card given. Advised to contact [facility] if any questions, concerns, side effects.   Rifampin 300 mg #60  (Rifampin #4 bottle) dispensed today per order by Vertell Novak, MD  Pt asks RN to fax PCP ([facility] ) the PCP TB Completion Letter.  ROI signed 06/13/2021. Letter faxed successfully.  RN walked pt to lab for LFT's. Josie Saunders, RN

## 2021-09-07 LAB — HEPATIC FUNCTION PANEL
ALT: 12 IU/L (ref 0–32)
AST: 18 IU/L (ref 0–40)
Albumin: 4.5 g/dL (ref 3.8–4.9)
Alkaline Phosphatase: 60 IU/L (ref 44–121)
Bilirubin Total: 0.3 mg/dL (ref 0.0–1.2)
Bilirubin, Direct: 0.12 mg/dL (ref 0.00–0.40)
Total Protein: 6.9 g/dL (ref 6.0–8.5)

## 2021-11-05 ENCOUNTER — Other Ambulatory Visit: Payer: Self-pay | Admitting: Obstetrics and Gynecology

## 2021-11-05 DIAGNOSIS — N951 Menopausal and female climacteric states: Secondary | ICD-10-CM

## 2022-01-01 ENCOUNTER — Ambulatory Visit (INDEPENDENT_AMBULATORY_CARE_PROVIDER_SITE_OTHER): Payer: BC Managed Care – PPO | Admitting: Family Medicine

## 2022-01-01 ENCOUNTER — Encounter: Payer: Self-pay | Admitting: Family Medicine

## 2022-01-01 VITALS — BP 120/80 | Ht 61.0 in | Wt 150.0 lb

## 2022-01-01 DIAGNOSIS — Z780 Asymptomatic menopausal state: Secondary | ICD-10-CM

## 2022-01-01 DIAGNOSIS — N951 Menopausal and female climacteric states: Secondary | ICD-10-CM

## 2022-01-01 DIAGNOSIS — Z30432 Encounter for removal of intrauterine contraceptive device: Secondary | ICD-10-CM

## 2022-01-01 MED ORDER — VENLAFAXINE HCL ER 37.5 MG PO CP24: 37.5000 mg | ORAL_CAPSULE | Freq: Every day | ORAL | 11 refills | Status: DC

## 2022-01-01 NOTE — Patient Instructions (Signed)
For the next 2 weeks:  -Take half a tablet of paxil daily. This is equivalent to '5mg'$  of paxil.  -Take Effexor daily  After two weeks - Stop the paxil - You will continue the effexor

## 2022-01-01 NOTE — Progress Notes (Signed)
   GYNECOLOGY PROBLEM  VISIT ENCOUNTER NOTE  Subjective:   JILLAINE WAREN is a 56 y.o. [phone] female here for a problem GYN visit.  Current complaints: increased hot flashes and wants IUD removed. [place] confirmed menopause in August 2022.   Denies abnormal vaginal bleeding, discharge, pelvic pain, problems with intercourse or other gynecologic concerns.    Gynecologic History No LMP recorded. (Menstrual status: IUD).  Contraception: post menopausal status  Health Maintenance Due  Topic Date Due   HIV Screening  Never done   Hepatitis C Screening  Never done   COLONOSCOPY (Pts 45-80yr Insurance coverage will need to be confirmed)  Never done   COVID-19 Vaccine (3 - Pfizer series) 12/17/2019   Zoster Vaccines- Shingrix (2 of 2) 06/26/2021    The following portions of the patient's history were reviewed and updated as appropriate: allergies, current medications, past family history, past medical history, past social history, past surgical history and problem list.  Review of Systems Pertinent items are noted in HPI.   Objective:  BP 120/80   Ht '5\' 1"'$  (1.549 m)   Wt 150 lb (68 kg)   BMI 28.34 kg/m  Gen: well appearing, NAD HEENT: no scleral icterus CV: RR Lung: Normal WOB Ext: warm well perfused  PELVIC: Normal appearing external genitalia; normal appearing vaginal mucosa and cervix.  No abnormal discharge noted.    Normal uterine size, no other palpable masses, no uterine or adnexal tenderness.  IUD Removal  Patient identified, informed consent performed, consent signed.  Patient was in the dorsal lithotomy position, normal external genitalia was noted.  A speculum was placed in the patient's vagina, normal discharge was noted, no lesions. The cervix was visualized, no lesions, no abnormal discharge. The strings of the IUD were not visualized, cytobrush was attempted which was unsuccessful. Cervical os was stenotic. Placed tenaculum and used dilator to help dilate os to be able  to introduce Kelly forceps. Kelly forceps were introduced into the endometrial cavity and the IUD strings were grasped and removed in its entirety.  Patient tolerated the procedure well.      Assessment and Plan:  1. Encounter for IUD removal Removed  2. Vasomotor symptoms due to menopause Discussed increasing estradiol vs changing to effexor Patient prefers to change to effexor  Discussed cross titration with paxil -- paxil has longer half life Plan to reduce to '5mg'$  paxil day (1/2 tab) and start effexor - venlafaxine XR (EFFEXOR-XR) 37.5 MG 24 hr capsule; Take 1 capsule (37.5 mg total) by mouth daily.  Dispense: 30 capsule; Refill: 11    Please refer to After Visit Summary for other counseling recommendations.   Return for vasomotor sx from menopause.  KCaren Macadam MD, MPH, ABFM Attending [place]

## 2022-03-07 ENCOUNTER — Encounter: Payer: Self-pay | Admitting: Family Medicine

## 2022-03-09 ENCOUNTER — Telehealth: Payer: Self-pay

## 2022-03-09 ENCOUNTER — Other Ambulatory Visit: Payer: Self-pay | Admitting: Obstetrics and Gynecology

## 2022-03-09 DIAGNOSIS — N951 Menopausal and female climacteric states: Secondary | ICD-10-CM

## 2022-03-09 MED ORDER — ESTRADIOL 0.075 MG/24HR TD PTWK: 0.0750 mg | MEDICATED_PATCH | TRANSDERMAL | 0 refills | Status: DC

## 2022-03-09 NOTE — Telephone Encounter (Signed)
Patient is returning missed call. Please advise 

## 2022-03-09 NOTE — Telephone Encounter (Signed)
Pls see msg from 03/07/22 that Connie Charles was involved in. Looks like [place] increased dose.

## 2022-03-09 NOTE — Telephone Encounter (Signed)
Contacted pt to let her know that I was putting her on a Recall List for 3 months out.  (We don't have the schedule for November.)

## 2022-03-09 NOTE — Telephone Encounter (Signed)
Pt calling states she has been having hot flashes reports she saw a provider a month ago and was prescribed a medication for menopause also using the hormone patch. Wants to know if the dosage can be upped.Can you up the dose? She seen Dr Ernestina Patches and shes not here anymore.

## 2022-03-09 NOTE — Telephone Encounter (Signed)
Pt aware I sent a message to [employer] to see if she can up the dose on her meds. Just waiting on a response.

## 2022-03-12 NOTE — Telephone Encounter (Signed)
Called pt to discuss dosage already being upped. No answer, will call back later.

## 2022-08-12 ENCOUNTER — Other Ambulatory Visit: Payer: Self-pay | Admitting: Family Medicine

## 2022-08-12 DIAGNOSIS — N951 Menopausal and female climacteric states: Secondary | ICD-10-CM

## 2022-08-24 ENCOUNTER — Encounter: Payer: Self-pay | Admitting: Nurse Practitioner

## 2022-08-27 DIAGNOSIS — M79644 Pain in right finger(s): Secondary | ICD-10-CM | POA: Insufficient documentation

## 2022-08-27 DIAGNOSIS — R7303 Prediabetes: Secondary | ICD-10-CM | POA: Insufficient documentation

## 2022-08-27 DIAGNOSIS — F4321 Adjustment disorder with depressed mood: Secondary | ICD-10-CM | POA: Insufficient documentation

## 2022-08-27 NOTE — Progress Notes (Unsigned)
PCP: Ezequiel Kayser, MD   No chief complaint on file.   HPI:      Ms. Connie Charles is a 57 y.o. [phone] whose LMP was No LMP recorded. (Menstrual status: IUD)., presents today for her annual examination.  Her menses are absent due to menopause. Had IUD in past. Having vasomoor sx, changed from paxil to effexor. Started climara 0.075 mg (not on progesterone) She {does:18564} have vasomotor sx.   Sex activity: {sex active: 315163}. She {does:18564} have vaginal dryness.  Last Pap: 04/21/21  Results were: no abnormalities /neg HPV DNA.  Hx of STDs: {STD hx:14358}  Last mammogram: 07/25/21  Results were: normal--routine follow-up in 12 months There is no FH of breast cancer. There is no FH of ovarian cancer. The patient {does:18564} do self-breast exams.  Colonoscopy: {hx:15363}  Repeat due after 10*** years.   Tobacco use: {tob:20664} Alcohol use: {Alcohol:11675} No drug use Exercise: {exercise:31265}  She {does:18564} get adequate calcium and Vitamin D in her diet.  Labs with PCP.   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   Chronic low back pain 04/05/2021   GERD (gastroesophageal reflux disease) 04/05/2021   Allergic rhinitis 10/21/2017   Edema 12/15/2014    Past Surgical History:  Procedure Laterality Date   AUGMENTATION MAMMAPLASTY Bilateral 10/2015   put in 10 yrs ago , replaced in 10/2015   BREAST SURGERY     BREAST AUGMENTATION   CHOLECYSTECTOMY     LYMPH NODE BIOPSY Right 12/15/2014   Procedure: LYMPH NODE BIOPSY;  Surgeon: Clyde Canterbury, MD;  Location: [facility] ORS;  Service: ENT;  Laterality: Right;   SALIVARY GLAND SURGERY Left     Family History  Problem Relation Age of Onset   Thyroid disease Mother    Breast cancer Neg Hx     Social History   Socioeconomic History   Marital status: Married    Spouse name: Not on file   Number of children: Not on file   Years of education: Not on file    Highest education level: Not on file  Occupational History   Not on file  Tobacco Use   Smoking status: Never   Smokeless tobacco: Never  Vaping Use   Vaping Use: Never used  Substance and Sexual Activity   Alcohol use: Not Currently    Comment: last alcohol was about 6 months ago   Drug use: Never   Sexual activity: Yes    Birth control/protection: I.U.D.    Comment: Mirena   Other Topics Concern   Not on file  Social History Narrative   Not on file   Social Determinants of Health   Financial Resource Strain: Not on file  Food Insecurity: Not on file  Transportation Needs: Not on file  Physical Activity: Not on file  Stress: Not on file  Social Connections: Not on file  Intimate Partner Violence: Not on file     Current Outpatient Medications:    acetaminophen (TYLENOL) 325 MG tablet, Take 325 mg by mouth every 6 (six) hours as needed., Disp: , Rfl:    azithromycin (ZITHROMAX) 250 MG tablet, TAKE 2 TABLETS BY MOUTH TODAY, THEN TAKE 1 TABLET DAILY FOR 4 DAYS, Disp: , Rfl:    estradiol (CLIMARA - DOSED IN MG/24 HR) 0.075 mg/24hr patch, Place 1 patch (0.075 mg total) onto the skin once a week., Disp: 12 patch, Rfl: 0   PARoxetine (PAXIL) 10 MG tablet, Take 0.5  tablets by mouth daily., Disp: , Rfl:    predniSONE (DELTASONE) 20 MG tablet, Take by mouth., Disp: , Rfl:    venlafaxine XR (EFFEXOR-XR) 37.5 MG 24 hr capsule, Take 1 capsule (37.5 mg total) by mouth daily., Disp: 30 capsule, Rfl: 11     ROS:  Review of Systems BREAST: No symptoms    Objective: There were no vitals taken for this visit.   OBGyn Exam  Results: No results found for this or any previous visit (from the past 24 hour(s)).  Assessment/Plan:  No diagnosis found.   No orders of the defined types were placed in this encounter.           GYN counsel {counseling: 16159}    F/U  No follow-ups on file.  Anjenette Gerbino B. Roseanna Koplin, PA-C 08/27/2022 5:08 PM

## 2022-08-28 ENCOUNTER — Encounter: Payer: Self-pay | Admitting: Obstetrics and Gynecology

## 2022-08-28 ENCOUNTER — Ambulatory Visit (INDEPENDENT_AMBULATORY_CARE_PROVIDER_SITE_OTHER): Payer: BC Managed Care – PPO | Admitting: Obstetrics and Gynecology

## 2022-08-28 VITALS — BP 104/60 | Ht 62.0 in | Wt 148.0 lb

## 2022-08-28 DIAGNOSIS — N951 Menopausal and female climacteric states: Secondary | ICD-10-CM | POA: Diagnosis not present

## 2022-08-28 DIAGNOSIS — Z01419 Encounter for gynecological examination (general) (routine) without abnormal findings: Secondary | ICD-10-CM | POA: Diagnosis not present

## 2022-08-28 DIAGNOSIS — Z1231 Encounter for screening mammogram for malignant neoplasm of breast: Secondary | ICD-10-CM

## 2022-08-28 DIAGNOSIS — Z1211 Encounter for screening for malignant neoplasm of colon: Secondary | ICD-10-CM

## 2022-08-28 DIAGNOSIS — E559 Vitamin D deficiency, unspecified: Secondary | ICD-10-CM | POA: Insufficient documentation

## 2022-08-28 NOTE — Patient Instructions (Signed)
I value your feedback and you entrusting us with your care. If you get a [facility] patient survey, I would appreciate you taking the time to let us know about your experience today. Thank you!  [facility]: [phone]      

## 2022-09-19 ENCOUNTER — Ambulatory Visit
Admission: RE | Admit: 2022-09-19 | Discharge: 2022-09-19 | Disposition: A | Discharge status: Home / Self Care | Payer: BC Managed Care – PPO | Source: Ambulatory Visit | Attending: Obstetrics and Gynecology | Admitting: Obstetrics and Gynecology

## 2022-09-19 DIAGNOSIS — Z1231 Encounter for screening mammogram for malignant neoplasm of breast: Secondary | ICD-10-CM | POA: Insufficient documentation

## 2022-09-21 ENCOUNTER — Other Ambulatory Visit: Payer: Self-pay | Admitting: Obstetrics and Gynecology

## 2022-09-21 DIAGNOSIS — R928 Other abnormal and inconclusive findings on diagnostic imaging of breast: Secondary | ICD-10-CM

## 2022-09-21 DIAGNOSIS — R921 Mammographic calcification found on diagnostic imaging of breast: Secondary | ICD-10-CM

## 2022-09-21 DIAGNOSIS — N6489 Other specified disorders of breast: Secondary | ICD-10-CM

## 2022-10-02 ENCOUNTER — Ambulatory Visit
Admission: RE | Admit: 2022-10-02 | Discharge: 2022-10-02 | Disposition: A | Discharge status: Home / Self Care | Payer: BC Managed Care – PPO | Source: Ambulatory Visit | Attending: Obstetrics and Gynecology | Admitting: Obstetrics and Gynecology

## 2022-10-02 DIAGNOSIS — R928 Other abnormal and inconclusive findings on diagnostic imaging of breast: Secondary | ICD-10-CM

## 2022-10-02 DIAGNOSIS — R921 Mammographic calcification found on diagnostic imaging of breast: Secondary | ICD-10-CM | POA: Diagnosis present

## 2022-10-02 DIAGNOSIS — N6489 Other specified disorders of breast: Secondary | ICD-10-CM | POA: Insufficient documentation

## 2022-10-04 ENCOUNTER — Encounter: Payer: Self-pay | Admitting: Obstetrics and Gynecology

## 2022-10-11 ENCOUNTER — Other Ambulatory Visit: Payer: Self-pay | Admitting: Obstetrics and Gynecology

## 2022-10-11 DIAGNOSIS — R921 Mammographic calcification found on diagnostic imaging of breast: Secondary | ICD-10-CM

## 2022-10-11 DIAGNOSIS — R928 Other abnormal and inconclusive findings on diagnostic imaging of breast: Secondary | ICD-10-CM

## 2023-03-12 ENCOUNTER — Ambulatory Visit (INDEPENDENT_AMBULATORY_CARE_PROVIDER_SITE_OTHER): Payer: BC Managed Care – PPO | Admitting: Certified Nurse Midwife

## 2023-03-12 ENCOUNTER — Encounter: Payer: Self-pay | Admitting: Certified Nurse Midwife

## 2023-03-12 VITALS — BP 105/65 | HR 77 | Ht 62.0 in | Wt 146.0 lb

## 2023-03-12 DIAGNOSIS — R232 Flushing: Secondary | ICD-10-CM | POA: Diagnosis not present

## 2023-03-12 DIAGNOSIS — R7303 Prediabetes: Secondary | ICD-10-CM | POA: Diagnosis not present

## 2023-03-12 DIAGNOSIS — R6889 Other general symptoms and signs: Secondary | ICD-10-CM

## 2023-03-12 DIAGNOSIS — N951 Menopausal and female climacteric states: Secondary | ICD-10-CM

## 2023-03-12 MED ORDER — VENLAFAXINE HCL ER 37.5 MG PO CP24: 37.5000 mg | ORAL_CAPSULE | Freq: Every day | ORAL | 2 refills | Status: DC

## 2023-03-12 MED ORDER — PROGESTERONE MICRONIZED 100 MG PO CAPS: 100.0000 mg | ORAL_CAPSULE | Freq: Every day | ORAL | 2 refills | Status: DC

## 2023-03-12 MED ORDER — ESTRADIOL 1 MG PO TABS: 1.0000 mg | ORAL_TABLET | Freq: Every day | ORAL | 2 refills | Status: DC

## 2023-03-12 NOTE — Progress Notes (Signed)
Mickey Farber, MD   Chief Complaint  Patient presents with   Menopause    HPI:      Connie Charles is a 57 y.o. [phone] whose LMP was No LMP recorded (lmp unknown). Patient is perimenopausal., presents today for perimenopausal symptoms of hot flashes, fatigue, sweats, weight gain. She had her Mirena IUD removed 12/2021, since then she has had two light menstrual cycles, last in May of this year. This lasted for 3 days. Her dose of Effexor was increased to 75mg  approximately 1y ago, she decreased back to 37.5mg  a month ago due to feeling that increased dose was causing increased symptoms of hot flashes & irritability. She is interested in hormone replacement to manage symptoms as OTC supplements have not improved her symptoms.    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   Chronic low back pain 04/05/2021   GERD (gastroesophageal reflux disease) 04/05/2021   Allergic rhinitis 10/21/2017   Edema 12/15/2014    Past Surgical History:  Procedure Laterality Date   AUGMENTATION MAMMAPLASTY Bilateral 10/2015   put in 10 yrs ago , replaced in 10/2015   BREAST SURGERY     BREAST AUGMENTATION   CHOLECYSTECTOMY     LYMPH NODE BIOPSY Right 12/15/2014   Procedure: LYMPH NODE BIOPSY;  Surgeon: Geanie Logan, MD;  Location: [facility] ORS;  Service: ENT;  Laterality: Right;   SALIVARY GLAND SURGERY Left     Family History  Problem Relation Age of Onset   Thyroid disease Mother    Breast cancer Neg Hx     Social History   Socioeconomic History   Marital status: Married    Spouse name: Not on file   Number of children: Not on file   Years of education: Not on file   Highest education level: Not on file  Occupational History   Not on file  Tobacco Use   Smoking status: Never   Smokeless tobacco: Never  Vaping Use   Vaping status: Never Used  Substance and Sexual Activity   Alcohol use: Not Currently    Comment: last alcohol  was about 6 months ago   Drug use: Never   Sexual activity: Yes  Other Topics Concern   Not on file  Social History Narrative   Not on file   Social Determinants of Health   Financial Resource Strain: Not on file  Food Insecurity: No Food Insecurity (06/28/2022)   Received from [facility], [facility]   Hunger Vital Sign    Worried About Running Out of Food in the Last Year: Never true    Ran Out of Food in the Last Year: Never true  Transportation Needs: No Transportation Needs (06/28/2022)   Received from [facility], [facility]   [facility] - Transportation    In the past 12 months, has lack of transportation kept you from medical appointments or from getting medications?: No    Lack of Transportation (Non-Medical): No  Physical Activity: Not on file  Stress: Not on file  Social Connections: Not on file  Intimate Partner Violence: Not on file    Outpatient Medications Prior to Visit  Medication Sig Dispense Refill   fluticasone (FLONASE) 50 MCG/ACT nasal spray Place into the nose.     naproxen (NAPROSYN) 500 MG tablet Take 500 mg by mouth 2 (two) times daily.     venlafaxine XR (EFFEXOR-XR) 75 MG  24 hr capsule Take by mouth.     No facility-administered medications prior to visit.      ROS:  Review of Systems  Constitutional:  Positive for fatigue and unexpected weight change.  Respiratory: Negative.    Cardiovascular: Negative.   Endocrine: Positive for heat intolerance.       Night sweats, hot flashes  Psychiatric/Behavioral:         Irritable     OBJECTIVE:   Vitals:  BP 105/65   Pulse 77   Ht 5\' 2"  (1.575 m)   Wt 146 lb (66.2 kg)   LMP  (LMP Unknown)   BMI 26.70 kg/m   Physical Exam Constitutional:      Appearance: Normal appearance.  Cardiovascular:     Rate and Rhythm: Normal rate.  Pulmonary:     Effort: Pulmonary effort is normal.  Neurological:     General: No focal  deficit present.     Mental Status: She is alert and oriented to person, place, and time.  Psychiatric:        Mood and Affect: Mood normal.        Behavior: Behavior normal.    Results: No results found for this or any previous visit (from the past 24 hour(s)).   Assessment/Plan: Vasomotor symptoms due to menopause  Prediabetes - Plan: Referral to Nutrition and Diabetes Services  Hot flashes  Heat intolerance  Start estradiol & progesterone, daily vs cyclic progesterone ordered given severity of symptoms. 12m supply provided, follow up in 52m. Effexor dose decreased at patient request.  Meds ordered this encounter  Medications   estradiol (ESTRACE) 1 MG tablet    Sig: Take 1 tablet (1 mg total) by mouth daily.    Dispense:  30 tablet    Refill:  2    Order Specific Question:   Supervising Provider    Answer:   Hildred Laser [[id number]]   progesterone (PROMETRIUM) 100 MG capsule    Sig: Take 1 capsule (100 mg total) by mouth daily.    Dispense:  30 capsule    Refill:  2    Order Specific Question:   Supervising Provider    Answer:   Hildred Laser [[id number]]   venlafaxine XR (EFFEXOR XR) 37.5 MG 24 hr capsule    Sig: Take 1 capsule (37.5 mg total) by mouth daily with breakfast.    Dispense:  30 capsule    Refill:  2    Order Specific Question:   Supervising Provider    Answer:   Hildred Laser [[id number]]     Dominica Severin, CNM 03/12/2023 1:48 PM

## 2023-03-24 DIAGNOSIS — D0511 Intraductal carcinoma in situ of right breast: Secondary | ICD-10-CM | POA: Insufficient documentation

## 2023-04-04 ENCOUNTER — Other Ambulatory Visit: Payer: Self-pay | Admitting: Certified Nurse Midwife

## 2023-04-16 ENCOUNTER — Other Ambulatory Visit: Payer: Self-pay | Admitting: Obstetrics and Gynecology

## 2023-04-16 ENCOUNTER — Ambulatory Visit
Admission: RE | Admit: 2023-04-16 | Discharge: 2023-04-16 | Disposition: A | Discharge status: Home / Self Care | Payer: BC Managed Care – PPO | Source: Ambulatory Visit | Attending: Obstetrics and Gynecology | Admitting: Obstetrics and Gynecology

## 2023-04-16 DIAGNOSIS — R928 Other abnormal and inconclusive findings on diagnostic imaging of breast: Secondary | ICD-10-CM | POA: Diagnosis present

## 2023-04-16 DIAGNOSIS — R921 Mammographic calcification found on diagnostic imaging of breast: Secondary | ICD-10-CM

## 2023-04-17 ENCOUNTER — Ambulatory Visit: Payer: BC Managed Care – PPO | Admitting: Dietician

## 2023-04-22 ENCOUNTER — Ambulatory Visit
Admission: RE | Admit: 2023-04-22 | Discharge: 2023-04-22 | Disposition: A | Discharge status: Home / Self Care | Payer: BC Managed Care – PPO | Source: Ambulatory Visit | Attending: Obstetrics and Gynecology | Admitting: Obstetrics and Gynecology

## 2023-04-22 ENCOUNTER — Inpatient Hospital Stay
Admission: RE | Admit: 2023-04-22 | Discharge: 2023-04-22 | Disposition: A | Discharge status: Home / Self Care | Payer: BC Managed Care – PPO | Source: Ambulatory Visit | Attending: Obstetrics and Gynecology

## 2023-04-22 DIAGNOSIS — D0511 Intraductal carcinoma in situ of right breast: Secondary | ICD-10-CM | POA: Insufficient documentation

## 2023-04-22 DIAGNOSIS — R921 Mammographic calcification found on diagnostic imaging of breast: Secondary | ICD-10-CM | POA: Insufficient documentation

## 2023-04-22 DIAGNOSIS — R928 Other abnormal and inconclusive findings on diagnostic imaging of breast: Secondary | ICD-10-CM | POA: Insufficient documentation

## 2023-04-22 HISTORY — PX: BREAST BIOPSY: SHX20

## 2023-04-22 MED ORDER — LIDOCAINE-EPINEPHRINE 1 %-1:100000 IJ SOLN
20.0000 mL | Freq: Once | INTRAMUSCULAR | Status: AC
  Administered 2023-04-22: 20 mL
  Filled 2023-04-22: qty 20

## 2023-04-22 MED ORDER — LIDOCAINE 1 % OPTIME INJ - NO CHARGE
5.0000 mL | Freq: Once | INTRAMUSCULAR | Status: AC
  Administered 2023-04-22: 5 mL
  Filled 2023-04-22: qty 6

## 2023-04-23 ENCOUNTER — Telehealth: Payer: Self-pay

## 2023-04-23 ENCOUNTER — Encounter: Payer: Self-pay | Admitting: *Deleted

## 2023-04-23 LAB — SURGICAL PATHOLOGY

## 2023-04-23 NOTE — Progress Notes (Signed)
Received referral for newly diagnosed breast cancer from [facility].  Patient already has appointments scheduled at [facility] breast clinic for 10/11.   Navigation is not needed at this time

## 2023-04-23 NOTE — Telephone Encounter (Addendum)
Connie Charles, spouse (on [place]), reports patient had breast biopsy yesterday. They received results today. She DCIS cancer. She has appointment at [facility] 10/11. They are inquiring whether she should continue on HRT.

## 2023-04-23 NOTE — Telephone Encounter (Signed)
Advised Eduardo of Alicia's response.

## 2023-04-23 NOTE — Telephone Encounter (Signed)
Discussed with Helmut Muster. Patient to stop HRT (Estradiol & Progesterone) ok to stop cold [place]. Advise may cause night sweats/hot flashes. She also recommends patient discuss with [facility] adjusting Effexor to help with these symptoms. Once patient has completed testing/treatment, can discuss non hormonal treatment for these symptoms.

## 2023-06-07 ENCOUNTER — Other Ambulatory Visit: Payer: Self-pay | Admitting: Certified Nurse Midwife

## 2023-07-31 NOTE — Progress Notes (Signed)
 Radiation Oncology Follow up Note   DIAGNOSIS: 58 y.o. 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  SURGICAL ONCOLOGIST: Dr. Elodie PLASTIC SURGEON: Dr. Linn  TREATMENT SUMMARY: RIGHT breast lumpectomy, implant removal, mastopexy (06/05/23) 1.1 cm DCIS (with 2 mm additional focus in lateral shave) Final lateral margin close (1 mm) pTisNx LEFT breast implant removal (06/05/23)    INTERVAL HISTORY: Connie Charles returns today for continued management of her RIGHT breast DCIS. She returns today for further discussion of adjuvant radiotherapy after being seen on 05/03/23 in initial consultation.  She is recovering well from surgery. No significant issues with her incision(s), which appear to be healing well. No issues with swelling or ROM. She has no new concerns related to her breasts including palpable masses, nodules, worrisome skin changes, or nipple changes.   She also reports no new headaches, chest pain, shortness of breath, nausea, vomiting, focal neurologic symptoms, new bony pain, unintentional weight loss, or other worrisome symptoms.  Her case was recently presented at our multi-disciplinary tumor board given close final lateral margin with the consensus being not to pursue a margin re-excision, from the breast surgeons that were in attendance.  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 acetaminophen  (TYLENOL ) 325 mg Cap Take 325 mg by mouth as needed    . cholecalciferol (VITAMIN D3) 2,000 unit capsule Take 3 capsules daily for 3 months, then reduce to 1 capsule daily thereafter for Vitamin D Deficiency. 360 capsule 11  . fluticasone propionate  (FLONASE) 50 mcg/actuation nasal spray Place 1 spray into both nostrils 2 (two) times daily as needed for Allergies 16 g prn  . venlafaxine  (EFFEXOR -XR) 37.5 MG XR capsule Take 1 capsule by mouth daily with breakfast    . LIDOCAINE  2 % solution  (Patient not taking: Reported on 07/31/2023)    . mupirocin (BACTROBAN) 2 % ointment Apply topically 3 (three) times daily (Patient not taking: Reported on 07/31/2023)     No current facility-administered medications for this visit.    ALLERGIES: Allergies  Allergen Reactions  . Buspirone Headache and Unknown    headache    PHYSICAL EXAMINATION: BP 137/72 (BP Location: Right upper arm, Patient Position: Sitting, BP Cuff Size: Large Adult)   Pulse 71   Temp 36.5 C (97.7 F) (Oral)   Resp 18   Wt 68.2 kg (150 lb 5.7 oz)   LMP 11/15/2017 (Exact Date) Comment: IUD, husband vasectomy  SpO2 96%   BMI 27.50 kg/m  Body mass index is 27.5 kg/m. General:  Appears well, no acute distress.  HEENT: Normocephalic, atraumatic, sclera anicteric.  Cardiovascular: Regular rate, warm and well perfused. No pacemaker/AICD. Respiratory: Breathing comfortably on RA. Gastrointestinal: Soft, non-distended. MSK: No lymphedema. She has full range of motion of both upper extremities at the shoulders.  Neurologic:  Alert and conversant, speech fluent, face symmetrical, moves all extremities equally. Breast/CW: Breasts were examined in the seated position. Incision(s) of the bilateral breasts are healing well, no evidence of infection. Psych: Appropriate affect.  STUDIES: Relevant studies were personally reviewed with pertinent findings as summarized in the history above.  Specimen radiograph (06/05/23)   IMPRESSION AND PLAN: Connie Charles is 58 y.o. with DCIS of her RIGHT breast, recovering well from recent  lumpectomy, bilateral implant removal, and RIGHT mastopexy. KPS 90-100.  We reviewed the role for adjuvant radiotherapy in her care to significantly decrease her  risk of developing a local recurrence in the breast. We reviewed the logistics and potential side effects of the treatment in detail. All of her questions were answered today and she wishes to proceed. She has signed informed consent and will undergo CT simulation on 08/07/23, with plans to begin radiation therapy shortly thereafter.   The treatment intent for Connie Charles is adjuvant. The patient is at an increased risk for recurrence without radiotherapy. We anticipate the use of a 3D treatment plan. We anticipate the use of kV and MV imaging will be necessary for appropriate treatment delivery. Following consideration of several different dose and fractionation options, anticipate proceeding with treatment to 4256 cGy in 16 daily fractions, followed by a 1000 cGy in 4 fraction tumor bed boost.  We ensured that she has the contact information for our clinic and she was encouraged to reach out at any point with questions or concerns as they arise.  Milford Portugal, NP  Attestation Statement:   I personally saw the patient and performed a substantive portion of the medical decision making, in conjunction with the Advanced Practice Provider for the condition/treatment of DCIS.  SARAH IDELL DRONES, MD  I spent a total of 32 minutes in both face-to-face and non-face-to-face activities, excluding procedures performed, for this visit on the date of this encounter.

## 2023-10-06 ENCOUNTER — Encounter: Payer: Self-pay | Admitting: Obstetrics and Gynecology

## 2023-10-07 ENCOUNTER — Other Ambulatory Visit: Payer: Self-pay | Admitting: Certified Nurse Midwife

## 2023-10-07 DIAGNOSIS — N951 Menopausal and female climacteric states: Secondary | ICD-10-CM

## 2023-10-07 MED ORDER — VENLAFAXINE HCL ER 75 MG PO CP24: 75.0000 mg | ORAL_CAPSULE | Freq: Every day | ORAL | 1 refills | Status: DC

## 2024-02-04 DIAGNOSIS — N951 Menopausal and female climacteric states: Secondary | ICD-10-CM | POA: Insufficient documentation

## 2024-02-04 DIAGNOSIS — E663 Overweight: Secondary | ICD-10-CM | POA: Insufficient documentation

## 2024-02-04 DIAGNOSIS — R590 Localized enlarged lymph nodes: Secondary | ICD-10-CM | POA: Insufficient documentation

## 2024-02-16 ENCOUNTER — Other Ambulatory Visit: Payer: Self-pay | Admitting: Certified Nurse Midwife

## 2024-03-25 ENCOUNTER — Encounter: Payer: Self-pay | Admitting: Certified Nurse Midwife

## 2024-03-25 ENCOUNTER — Ambulatory Visit: Admitting: Certified Nurse Midwife

## 2024-03-25 VITALS — BP 112/72 | HR 80 | Ht 61.0 in | Wt 137.9 lb

## 2024-03-25 DIAGNOSIS — N951 Menopausal and female climacteric states: Secondary | ICD-10-CM

## 2024-03-25 DIAGNOSIS — Z01419 Encounter for gynecological examination (general) (routine) without abnormal findings: Secondary | ICD-10-CM

## 2024-03-25 DIAGNOSIS — Z1329 Encounter for screening for other suspected endocrine disorder: Secondary | ICD-10-CM

## 2024-03-25 DIAGNOSIS — Z79899 Other long term (current) drug therapy: Secondary | ICD-10-CM

## 2024-03-25 DIAGNOSIS — Z1211 Encounter for screening for malignant neoplasm of colon: Secondary | ICD-10-CM

## 2024-03-25 NOTE — Progress Notes (Unsigned)
   ANNUAL EXAM Patient name: Connie Charles MRN [id number]  Date of birth: 1966-04-13 Chief Complaint:   Gynecologic Exam  History of Present Illness:   Connie Charles is a 58 y.o. [phone] {race:25618} female being seen today for a routine annual exam.  Current complaints: ***  No LMP recorded. Patient is perimenopausal.   Upstream - 03/25/24 0934      Pregnancy Intention Screening   Does the patient want to become pregnant in the next year? N/A    Does the patient's partner want to become pregnant in the next year? N/A    Would the patient like to discuss contraceptive options today? N/A      Contraception Wrap Up   Contraception Counseling Provided No    How was the end contraceptive method provided? N/A        The pregnancy intention screening data noted above was reviewed. Potential methods of contraception were discussed. The patient elected to proceed with No data recorded.      Component Value Date/Time   DIAGPAP  04/21/2021 1535    - Negative for intraepithelial lesion or malignancy (NILM)   HPVHIGH Negative 04/21/2021 1535   ADEQPAP  04/21/2021 1535    Satisfactory for evaluation; transformation zone component PRESENT.       Last pap ***. Results were: {Pap findings:25134}. H/O abnormal pap: {yes/yes***/no:23866} Last mammogram: ***. Results were: {normal, abnormal, n/a:23837}. Family h/o breast cancer: {yes***/no:23838} Last colonoscopy: ***. Results were: {normal, abnormal, n/a:23837}. Family h/o colorectal cancer: {yes***/no:23838}     03/25/2024    9:34 AM  Depression screen PHQ 2/9  Decreased Interest 0  Down, Depressed, Hopeless 0  PHQ - 2 Score 0         No data to display             Past Medical History:  Diagnosis Date  . Anemia   . Dry eyes, bilateral   . IgG4 deficiency (HCC)   . IgG4-related sclerosing disease (HCC)     Family History  Problem Relation Age of Onset  . Thyroid disease Mother   . Breast cancer Neg Hx     Review of Systems:   Pertinent items are noted in HPI Denies any headaches, blurred vision, fatigue, shortness of breath, chest pain, abdominal pain, abnormal vaginal discharge/itching/odor/irritation, problems with periods, bowel movements, urination, or intercourse unless otherwise stated above. Pertinent History Reviewed:  Reviewed past medical,surgical, social and family history.  Reviewed problem list, medications and allergies. Physical Assessment:   Vitals:   03/25/24 0931  BP: 112/72  Pulse: 80  Weight: 137 lb 14.4 oz (62.6 kg)  Height: 5' 1 (1.549 m)  Body mass index is 26.06 kg/m.       Physical Exam   No results found for this or any previous visit (from the past 24 hours).  Assessment & Plan:  1. Women's annual routine gynecological examination (Primary)  2. Encounter for screening colonoscopy   Mammogram: {Mammo f/u:25212}, or sooner if problems Colonoscopy: {TCS f/u:25213}, or sooner if problems  No orders of the defined types were placed in this encounter.   Meds: No orders of the defined types were placed in this encounter.   Follow-up: Return in 1 year (on 03/25/2025) for Annual exam.  [name] Cisco, CNM 03/25/2024 9:49 AM

## 2024-03-26 ENCOUNTER — Ambulatory Visit: Payer: Self-pay | Admitting: Certified Nurse Midwife

## 2024-03-26 DIAGNOSIS — N951 Menopausal and female climacteric states: Secondary | ICD-10-CM

## 2024-03-26 DIAGNOSIS — D0511 Intraductal carcinoma in situ of right breast: Secondary | ICD-10-CM

## 2024-03-26 LAB — COMPREHENSIVE METABOLIC PANEL WITH GFR
ALT: 22 IU/L (ref 0–32)
AST: 21 IU/L (ref 0–40)
Albumin: 4.3 g/dL (ref 3.8–4.9)
Alkaline Phosphatase: 123 IU/L — ABNORMAL HIGH (ref 44–121)
BUN/Creatinine Ratio: 13 (ref 9–23)
BUN: 8 mg/dL (ref 6–24)
Bilirubin Total: 0.3 mg/dL (ref 0.0–1.2)
CO2: 24 mmol/L (ref 20–29)
Calcium: 9.7 mg/dL (ref 8.7–10.2)
Chloride: 101 mmol/L (ref 96–106)
Creatinine, Ser: 0.62 mg/dL (ref 0.57–1.00)
Globulin, Total: 3.4 g/dL (ref 1.5–4.5)
Glucose: 104 mg/dL — ABNORMAL HIGH (ref 70–99)
Potassium: 4.4 mmol/L (ref 3.5–5.2)
Sodium: 141 mmol/L (ref 134–144)
Total Protein: 7.7 g/dL (ref 6.0–8.5)
eGFR: 105 mL/min/1.73 (ref >59)

## 2024-03-26 LAB — CBC
Hematocrit: 40.7 % (ref 34.0–46.6)
Hemoglobin: 13.4 g/dL (ref 11.1–15.9)
MCH: 29.8 pg (ref 26.6–33.0)
MCHC: 32.9 g/dL (ref 31.5–35.7)
MCV: 90 fL (ref 79–97)
Platelets: 298 x10E3/uL (ref 150–450)
RBC: 4.5 x10E6/uL (ref 3.77–5.28)
RDW: 12.8 % (ref 11.7–15.4)
WBC: 9.8 x10E3/uL (ref 3.4–10.8)

## 2024-03-26 LAB — TSH+FREE T4
Free T4: 1.06 ng/dL (ref 0.82–1.77)
TSH: 2.77 u[IU]/mL (ref 0.450–4.500)

## 2024-03-26 MED ORDER — VEOZAH 45 MG PO TABS: 1.0000 | ORAL_TABLET | Freq: Every day | ORAL | 0 refills | Status: AC

## 2024-03-26 MED ORDER — VENLAFAXINE HCL ER 75 MG PO CP24: 75.0000 mg | ORAL_CAPSULE | Freq: Every day | ORAL | 3 refills | Status: AC
